# Patient Record
Sex: Female | Born: 1940 | Race: White | Hispanic: No | Marital: Married | Smoking: Former smoker
Health system: Southern US, Community
[De-identification: ages and names within clinical notes are randomized; demographics above are authoritative.]

## PROBLEM LIST (undated history)

## (undated) DIAGNOSIS — R011 Cardiac murmur, unspecified: Secondary | ICD-10-CM

## (undated) DIAGNOSIS — I10 Essential (primary) hypertension: Secondary | ICD-10-CM

## (undated) DIAGNOSIS — E119 Type 2 diabetes mellitus without complications: Secondary | ICD-10-CM

## (undated) DIAGNOSIS — I519 Heart disease, unspecified: Secondary | ICD-10-CM

## (undated) DIAGNOSIS — E785 Hyperlipidemia, unspecified: Secondary | ICD-10-CM

## (undated) HISTORY — DX: Cardiac murmur, unspecified: R01.1

## (undated) HISTORY — PX: TUBAL LIGATION: SHX77

## (undated) HISTORY — PX: CARDIAC SURGERY: SHX584

## (undated) HISTORY — PX: CHOLECYSTECTOMY: SHX55

## (undated) HISTORY — DX: Heart disease, unspecified: I51.9

## (undated) HISTORY — PX: EYE SURGERY: SHX253

## (undated) HISTORY — PX: APPENDECTOMY: SHX54

---

## 1967-06-01 HISTORY — PX: OTHER SURGICAL HISTORY: SHX169

## 2000-05-31 HISTORY — PX: OTHER SURGICAL HISTORY: SHX169

## 2007-04-14 ENCOUNTER — Ambulatory Visit: Payer: Self-pay | Admitting: Endocrinology

## 2007-04-17 ENCOUNTER — Ambulatory Visit: Payer: Self-pay | Admitting: Endocrinology

## 2007-06-06 ENCOUNTER — Ambulatory Visit: Payer: Self-pay | Admitting: Vascular Surgery

## 2008-09-19 ENCOUNTER — Ambulatory Visit: Payer: Self-pay | Admitting: Gastroenterology

## 2010-05-26 ENCOUNTER — Ambulatory Visit: Payer: Self-pay | Admitting: Endocrinology

## 2013-03-19 ENCOUNTER — Emergency Department: Payer: Self-pay | Admitting: Emergency Medicine

## 2014-12-17 ENCOUNTER — Ambulatory Visit (INDEPENDENT_AMBULATORY_CARE_PROVIDER_SITE_OTHER): Payer: Medicare Other | Admitting: Podiatry

## 2014-12-17 ENCOUNTER — Encounter: Payer: Self-pay | Admitting: Podiatry

## 2014-12-17 VITALS — BP 162/73 | HR 77 | Resp 16

## 2014-12-17 DIAGNOSIS — L853 Xerosis cutis: Secondary | ICD-10-CM

## 2014-12-17 DIAGNOSIS — R234 Changes in skin texture: Secondary | ICD-10-CM

## 2014-12-17 MED ORDER — UREA 39 % EX CREA: 1.0000 "application " | TOPICAL_CREAM | Freq: Every day | CUTANEOUS | Status: DC

## 2014-12-17 NOTE — Progress Notes (Signed)
   Subjective:    Patient ID: Wendy Taylor, female    DOB: March 29, 1941, 74 y.o.   MRN: [id number]  HPI 74 year old female presents the office today with concerns of dry cracked heels bilateral feet. She denies any bleeding denies any redness or drainage. She has tried some over-the-counter moisturizers without any relief. She says the area does become painful at the crack deeper. No other complaints at this time.   Review of Systems  All other systems reviewed and are negative.      Objective:   Physical Exam AAO x3, NAD DP/PT pulses palpable bilaterally, CRT less than 3 seconds Protective sensation intact with Simms Weinstein monofilament, vibratory sensation intact, Achilles tendon reflex intact There is dry cracked heels bilaterally with fissures present. There is no bleeding or open wounds present at this time. No areas of tenderness to bilateral lower extremities. MMT 5/5, ROM WNL.  No open lesions or pre-ulcerative lesions.  No overlying edema, erythema, increase in warmth to bilateral lower extremities.  No pain with calf compression, swelling, warmth, erythema bilaterally.      Assessment & Plan:  74 year old female with cirrhosis, heel fissures -Treatment options discussed including all alternatives, risks, and complications -Fissures were debrided hyperkeratotic tissue.  -Prescribed urea 49% cream. -Follow-up is symptoms are not resolved within 4-6 weeks or sooner if any problems arise. In the meantime, encouraged to call the office with any questions, concerns, change in symptoms.   Ovid CurdMatthew Wagoner, DPM

## 2015-07-09 ENCOUNTER — Other Ambulatory Visit: Payer: Self-pay | Admitting: Family Medicine

## 2015-07-09 DIAGNOSIS — Z13820 Encounter for screening for osteoporosis: Secondary | ICD-10-CM

## 2016-02-06 ENCOUNTER — Emergency Department: Payer: Medicare HMO

## 2016-02-06 ENCOUNTER — Emergency Department
Admission: EM | Admit: 2016-02-06 | Discharge: 2016-02-06 | Disposition: A | Discharge status: Home / Self Care | Payer: Medicare HMO | Attending: Emergency Medicine | Admitting: Emergency Medicine

## 2016-02-06 DIAGNOSIS — E119 Type 2 diabetes mellitus without complications: Secondary | ICD-10-CM | POA: Diagnosis not present

## 2016-02-06 DIAGNOSIS — Z7984 Long term (current) use of oral hypoglycemic drugs: Secondary | ICD-10-CM | POA: Insufficient documentation

## 2016-02-06 DIAGNOSIS — K5901 Slow transit constipation: Secondary | ICD-10-CM | POA: Insufficient documentation

## 2016-02-06 DIAGNOSIS — Z794 Long term (current) use of insulin: Secondary | ICD-10-CM | POA: Insufficient documentation

## 2016-02-06 DIAGNOSIS — Z87891 Personal history of nicotine dependence: Secondary | ICD-10-CM | POA: Insufficient documentation

## 2016-02-06 DIAGNOSIS — R339 Retention of urine, unspecified: Secondary | ICD-10-CM | POA: Insufficient documentation

## 2016-02-06 DIAGNOSIS — I10 Essential (primary) hypertension: Secondary | ICD-10-CM | POA: Diagnosis not present

## 2016-02-06 DIAGNOSIS — K5900 Constipation, unspecified: Secondary | ICD-10-CM | POA: Diagnosis present

## 2016-02-06 LAB — BASIC METABOLIC PANEL
Anion gap: 8 (ref 5–15)
BUN: 13 mg/dL (ref 6–20)
CHLORIDE: 96 mmol/L — AB (ref 101–111)
CO2: 26 mmol/L (ref 22–32)
Calcium: 9.3 mg/dL (ref 8.9–10.3)
Creatinine, Ser: 0.73 mg/dL (ref 0.44–1.00)
GFR calc Af Amer: >60 mL/min (ref >60)
GFR calc non Af Amer: >60 mL/min (ref >60)
GLUCOSE: 124 mg/dL — AB (ref 65–99)
POTASSIUM: 4.3 mmol/L (ref 3.5–5.1)
SODIUM: 130 mmol/L — AB (ref 135–145)

## 2016-02-06 LAB — CBC
HEMATOCRIT: 34.9 % — AB (ref 35.0–47.0)
HEMOGLOBIN: 12.1 g/dL (ref 12.0–16.0)
MCH: 31.4 pg (ref 26.0–34.0)
MCHC: 34.8 g/dL (ref 32.0–36.0)
MCV: 90.1 fL (ref 80.0–100.0)
Platelets: 264 10*3/uL (ref 150–440)
RBC: 3.87 MIL/uL (ref 3.80–5.20)
RDW: 13.9 % (ref 11.5–14.5)
WBC: 10.9 10*3/uL (ref 3.6–11.0)

## 2016-02-06 LAB — URINALYSIS COMPLETE WITH MICROSCOPIC (ARMC ONLY)
BACTERIA UA: NONE SEEN
Bilirubin Urine: NEGATIVE
Glucose, UA: 50 mg/dL — AB
Hgb urine dipstick: NEGATIVE
Ketones, ur: NEGATIVE mg/dL
Leukocytes, UA: NEGATIVE
Nitrite: NEGATIVE
PROTEIN: NEGATIVE mg/dL
SPECIFIC GRAVITY, URINE: 1.011 (ref 1.005–1.030)
WBC UA: NONE SEEN WBC/hpf (ref 0–5)
pH: 7 (ref 5.0–8.0)

## 2016-02-06 MED ORDER — MINERAL OIL RE ENEM
1.0000 | ENEMA | Freq: Once | RECTAL | Status: AC
  Administered 2016-02-06: 1 via RECTAL

## 2016-02-06 MED ORDER — DOCUSATE SODIUM 100 MG PO CAPS: 100.0000 mg | ORAL_CAPSULE | Freq: Two times a day (BID) | ORAL | 2 refills | Status: DC | PRN

## 2016-02-06 NOTE — ED Provider Notes (Signed)
[facility] Emergency Department Provider Note   ____________________________________________    I have reviewed the triage vital signs and the nursing notes.   HISTORY  Chief Complaint Constipation     HPI Wendy Taylor is a 75 y.o. female who presents with complaints of constipation. Patient reports she had an eye procedure done 3 days ago and since then has not had a bowel movement. She reports they did "not put me fully under ". She notes is unusual for her to not have a bowel movement daily. She denies abdominal pain but feels mildly bloated. No fevers or chills. No nausea or vomiting. Taking milk of magnesia without improvement.   No past medical history on file.  There are no active problems to display for this patient.   No past surgical history on file.  Prior to Admission medications   Medication Sig Start Date End Date Taking? Authorizing Provider  amLODipine (NORVASC) 2.5 MG tablet  11/20/14   Historical Provider, MD  aspirin 325 MG tablet Take by mouth.    Historical Provider, MD  Cholecalciferol (VITAMIN D-1000 MAX ST) 1000 UNITS tablet Take by mouth.    Historical Provider, MD  clopidogrel (PLAVIX) 75 MG tablet  11/20/14   Historical Provider, MD  HUMALOG MIX 50/50 KWIKPEN (50-50) 100 UNIT/ML Kwikpen  12/10/14   Historical Provider, MD  lisinopril (PRINIVIL,ZESTRIL) 20 MG tablet  11/20/14   Historical Provider, MD  loratadine (CLARITIN) 10 MG tablet Take 10 mg by mouth daily.    Historical Provider, MD  metFORMIN (GLUCOPHAGE) 500 MG tablet Daily. 02/03/12   Historical Provider, MD  simvastatin (ZOCOR) 20 MG tablet Take by mouth.    Historical Provider, MD  Urea 39 % CREA Apply 1 application topically daily. 12/17/14   Vivi Barrack, DPM  vitamin B-12 (CYANOCOBALAMIN) 1000 MCG tablet Take 1,000 mcg by mouth daily.    Historical Provider, MD     Allergies Cephalexin; Ciprofloxacin; and Penicillins  No family history on  file.  Social History Social History  Substance Use Topics  . Smoking status: Former Games developer  . Smokeless tobacco: Never Used  . Alcohol use Not on file    Review of Systems  Constitutional: No fever/chills Eyes: Recent right eye procedure ENT: No sore throat. Cardiovascular: Denies chest pain. Respiratory: Denies shortness of breath. The cough Gastrointestinal: No abdominal pain.  No nausea, no vomiting. Constipation as above   Genitourinary: Negative for dysuria. Long history of difficulty urinating Musculoskeletal: Negative for back pain. Skin: Negative for rash. Neurological: Negative for headaches   10-point ROS otherwise negative.  ____________________________________________   PHYSICAL EXAM:  VITAL SIGNS: ED Triage Vitals  Enc Vitals Group     BP 02/06/16 0507 (!) 162/66     Pulse Rate 02/06/16 0507 (!) 103     Resp 02/06/16 0507 15     Temp 02/06/16 0507 98.1 F (36.7 C)     Temp Source 02/06/16 0507 Oral     SpO2 02/06/16 0507 97 %     Weight 02/06/16 0508 160 lb (72.6 kg)     Height 02/06/16 0508 5\' 2"  (1.575 m)     Head Circumference --      Peak Flow --      Pain Score 02/06/16 0748 5     Pain Loc --      Pain Edu? --      Excl. in GC? --     Constitutional: Alert and oriented. No acute distress.  Pleasant and interactive Eyes: Conjunctivae are normal.  Head: Atraumatic. Nose: No congestion/rhinnorhea. Mouth/Throat: Mucous membranes are moist.   Neck:  Painless ROM Cardiovascular: Normal rate, regular rhythm. Grossly normal heart sounds.  Good peripheral circulation. Respiratory: Normal respiratory effort.  No retractions. Lungs CTAB. Gastrointestinal: Soft and nontender. No distention.  No CVA tenderness. Genitourinary: deferred, Foley placed by nurse prior to my arrival Musculoskeletal: No lower extremity tenderness nor edema.  Warm and well perfused Neurologic:  Normal speech and language. No gross focal neurologic deficits are appreciated.   Skin:  Skin is warm, dry and intact. No rash noted. Psychiatric: Mood and affect are normal. Speech and behavior are normal.  ____________________________________________   LABS (all labs ordered are listed, but only abnormal results are displayed)  Labs Reviewed - No data to display ____________________________________________  EKG  None ____________________________________________  RADIOLOGY  X-ray shows mild to moderate stool burden, no free air ____________________________________________   PROCEDURES  Procedure(s) performed: No    Critical Care performed: No ____________________________________________   INITIAL IMPRESSION / ASSESSMENT AND PLAN / ED COURSE  Pertinent labs & imaging results that were available during my care of the patient were reviewed by me and considered in my medical decision making (see chart for details).  She presents with complaints of constipation and abdominal bloating. Found to have greater than 1000 cc of urine, relief of discomfort with full catheter. X-ray shows mild to moderate stool burden, we will give an enema. But I suspect urinary retention is related to sedation medication used for her procedure.  Clinical Course  No significant stool after enema. Discussed with the patient that we will send her home with a Foley, leg bag and follow-up with urology which she agrees with. Discussed return precautions including worsening abdominal pain, fever or chills nausea or vomiting ____________________________________________   FINAL CLINICAL IMPRESSION(S) / ED DIAGNOSES  Final diagnoses:  Urinary retention  Slow transit constipation      NEW MEDICATIONS STARTED DURING THIS VISIT:  New Prescriptions   No medications on file     Note:  This document was prepared using Dragon voice recognition software and may include unintentional dictation errors.    Jene Everyobert Lydiah Pong, MD 02/06/16 [phone]

## 2016-02-06 NOTE — ED Triage Notes (Signed)
Pt to triage via w/c with no distress noted; pt reports no BM x week; denies abd pain, denies N/V; has taken MOM without relief

## 2016-02-06 NOTE — ED Notes (Signed)
Pt bladder scanned. Reading at .

## 2016-02-06 NOTE — ED Notes (Signed)
Pt stating that she has not been able to "go to the bathroom." Pt stating that she had cataract surgery on Tuesday and has not had a BM since then. Pt stating that she is passing minimal gas. Pt stating she has a BM usually every other day. Pt stating that she hasn't voided since 10pm yesterday. Pt stating I was unable to void overnight. Pt took MOM at home twice.

## 2016-02-06 NOTE — ED Notes (Signed)
Report given to Donald, RN. 

## 2016-02-07 ENCOUNTER — Emergency Department
Admission: EM | Admit: 2016-02-07 | Discharge: 2016-02-07 | Disposition: A | Discharge status: Home / Self Care | Payer: Medicare HMO | Attending: Emergency Medicine | Admitting: Emergency Medicine

## 2016-02-07 ENCOUNTER — Encounter: Payer: Self-pay | Admitting: Emergency Medicine

## 2016-02-07 DIAGNOSIS — R319 Hematuria, unspecified: Secondary | ICD-10-CM | POA: Diagnosis present

## 2016-02-07 DIAGNOSIS — E119 Type 2 diabetes mellitus without complications: Secondary | ICD-10-CM | POA: Diagnosis not present

## 2016-02-07 DIAGNOSIS — Z79899 Other long term (current) drug therapy: Secondary | ICD-10-CM | POA: Insufficient documentation

## 2016-02-07 DIAGNOSIS — Z7982 Long term (current) use of aspirin: Secondary | ICD-10-CM | POA: Diagnosis not present

## 2016-02-07 DIAGNOSIS — I10 Essential (primary) hypertension: Secondary | ICD-10-CM | POA: Insufficient documentation

## 2016-02-07 DIAGNOSIS — Z7984 Long term (current) use of oral hypoglycemic drugs: Secondary | ICD-10-CM | POA: Diagnosis not present

## 2016-02-07 DIAGNOSIS — Z87891 Personal history of nicotine dependence: Secondary | ICD-10-CM | POA: Diagnosis not present

## 2016-02-07 HISTORY — DX: Type 2 diabetes mellitus without complications: E11.9

## 2016-02-07 HISTORY — DX: Essential (primary) hypertension: I10

## 2016-02-07 HISTORY — DX: Hyperlipidemia, unspecified: E78.5

## 2016-02-07 LAB — URINALYSIS COMPLETE WITH MICROSCOPIC (ARMC ONLY)
BILIRUBIN URINE: NEGATIVE
Glucose, UA: NEGATIVE mg/dL
KETONES UR: NEGATIVE mg/dL
LEUKOCYTES UA: NEGATIVE
NITRITE: NEGATIVE
PH: 6 (ref 5.0–8.0)
PROTEIN: NEGATIVE mg/dL
SQUAMOUS EPITHELIAL / LPF: NONE SEEN
Specific Gravity, Urine: 1.008 (ref 1.005–1.030)

## 2016-02-07 NOTE — ED Provider Notes (Signed)
[facility] Emergency Department Provider Note  Time seen: 4:40 PM  I have reviewed the triage vital signs and the nursing notes.   HISTORY  Chief Complaint Hematuria    HPI Wendy Taylor is a 75 y.o. female with a past medical history of diabetes, hypertension, hyperlipidemia who presents the emergency department for hematuria. According to the patient she had eye surgery performed several days ago, was feeling constipated unable to urinate so she came to the emergency department yesterday had greater than 1 L of urine in her bladder and a Foley catheter was placed. Patient went home yesterday from the emergency department with a leg bag, catheter in place with urology follow-up on Monday. Patient states today she notice what appears to be blood in the urine so she came to the emergency department for evaluation. Denies any abdominal pain. States she has had several bowel movements since going home. Denies any fever.  Past Medical History:  Diagnosis Date  . Diabetes mellitus without complication (HCC)   . Hyperlipidemia   . Hypertension     There are no active problems to display for this patient.   Past Surgical History:  Procedure Laterality Date  . cardiac bypass   2002  . CHOLECYSTECTOMY    . EYE SURGERY    . tubial ligation   1969    Prior to Admission medications   Medication Sig Start Date End Date Taking? Authorizing Provider  amLODipine (NORVASC) 2.5 MG tablet  11/20/14   Historical Provider, MD  aspirin 325 MG tablet Take by mouth.    Historical Provider, MD  Cholecalciferol (VITAMIN D-1000 MAX ST) 1000 UNITS tablet Take by mouth.    Historical Provider, MD  clopidogrel (PLAVIX) 75 MG tablet  11/20/14   Historical Provider, MD  docusate sodium (COLACE) 100 MG capsule Take 1 capsule (100 mg total) by mouth 2 (two) times daily as needed for moderate constipation. 02/06/16 02/05/17  Jene Everyobert Kinner, MD  HUMALOG MIX 50/50 KWIKPEN (50-50) 100 UNIT/ML  Stephanie CoupKwikpen  12/10/14   Historical Provider, MD  lisinopril (PRINIVIL,ZESTRIL) 20 MG tablet  11/20/14   Historical Provider, MD  loratadine (CLARITIN) 10 MG tablet Take 10 mg by mouth daily.    Historical Provider, MD  metFORMIN (GLUCOPHAGE) 500 MG tablet Daily. 02/03/12   Historical Provider, MD  simvastatin (ZOCOR) 20 MG tablet Take by mouth.    Historical Provider, MD  Urea 39 % CREA Apply 1 application topically daily. 12/17/14   Vivi BarrackMatthew R Wagoner, DPM  vitamin B-12 (CYANOCOBALAMIN) 1000 MCG tablet Take 1,000 mcg by mouth daily.    Historical Provider, MD    Allergies  Allergen Reactions  . Cephalexin Nausea And Vomiting  . Ciprofloxacin Nausea And Vomiting  . Penicillins Nausea And Vomiting    No family history on file.  Social History Social History  Substance Use Topics  . Smoking status: Former Games developermoker  . Smokeless tobacco: Never Used  . Alcohol use No    Review of Systems Constitutional: Negative for fever. Cardiovascular: Negative for chest pain. Respiratory: Negative for shortness of breath. Gastrointestinal: Negative for abdominal pain Genitourinary: Negative for dysuria.Foley catheter in place. Positive for hematuria. Musculoskeletal: Negative for back pain. Neurological: Negative for headache 10-point ROS otherwise negative.  ____________________________________________   PHYSICAL EXAM:  VITAL SIGNS: ED Triage Vitals  Enc Vitals Group     BP 02/07/16 1546 (!) 161/60     Pulse Rate 02/07/16 1546 94     Resp 02/07/16 1546 18  Temp 02/07/16 1546 98.1 F (36.7 C)     Temp Source 02/07/16 1546 Oral     SpO2 02/07/16 1546 96 %     Weight --      Height --      Head Circumference --      Peak Flow --      Pain Score 02/07/16 1623 0     Pain Loc --      Pain Edu? --      Excl. in GC? --     Constitutional: Alert and oriented. Well appearing and in no distress. Eyes: Normal exam ENT   Head: Normocephalic and atraumatic   Mouth/Throat: Mucous  membranes are moist. Cardiovascular: Normal rate, regular rhythm. No murmur Respiratory: Normal respiratory effort without tachypnea nor retractions. Breath sounds are clear Gastrointestinal: Soft and nontender. No distention.  Musculoskeletal: Nontender with normal range of motion in all extremities.  Neurologic:  Normal speech and language. No gross focal neurologic deficits Skin:  Skin is warm, dry and intact.  Psychiatric: Mood and affect are normal. ____________________________________________   INITIAL IMPRESSION / ASSESSMENT AND PLAN / ED COURSE  Pertinent labs & imaging results that were available during my care of the patient were reviewed by me and considered in my medical decision making (see chart for details).  Overall the patient appears very well, has noted blood in her urine today. We will check a urinalysis as well as a urine culture. I suspect the blood is likely due to to your dictation/inflammation from catheter insertion. Patient has no abdominal tenderness, denies abdominal pain.  Urinalysis shows blood but no white blood cells or bacteria. Suspect hematuria is from traumatic Foley insertion. I sent a urine culture. Patient has a follow-up with urology on Monday.  ____________________________________________   FINAL CLINICAL IMPRESSION(S) / ED DIAGNOSES  Hematuria    Minna Antis, MD 02/07/16 1751

## 2016-02-07 NOTE — ED Triage Notes (Signed)
Pt seen yesterday for urine blockage and had a cathter placed with leg bag. Pt states at noon today she notice blood in urine. Urine Pink in color moderate amount. Small clots noted in leg bag. Denies pain

## 2016-02-07 NOTE — ED Notes (Signed)
Discharge instructions reviewed with patient. Patient verbalized understanding. Patient ambulated to lobby without difficulty.   

## 2016-02-09 ENCOUNTER — Encounter: Payer: Self-pay | Admitting: Urology

## 2016-02-09 ENCOUNTER — Ambulatory Visit (INDEPENDENT_AMBULATORY_CARE_PROVIDER_SITE_OTHER): Payer: Medicare HMO | Admitting: Urology

## 2016-02-09 VITALS — BP 165/101 | HR 91 | Ht 62.0 in | Wt 160.6 lb

## 2016-02-09 DIAGNOSIS — R338 Other retention of urine: Secondary | ICD-10-CM | POA: Diagnosis not present

## 2016-02-09 DIAGNOSIS — R310 Gross hematuria: Secondary | ICD-10-CM | POA: Diagnosis not present

## 2016-02-09 LAB — URINE CULTURE: Culture: NO GROWTH

## 2016-02-09 NOTE — Progress Notes (Signed)
02/09/2016 3:49 PM   Wendy Taylor 1940/11/26 [id number]  Referring provider: Titus Mould, NP [address] [place], [place] [address]  Chief Complaint  Patient presents with  . Hematuria    New patient ER follow up  . Urinary Retention    HPI: Patient is a 75 year old Caucasian female who is referred by [facility] ED for urinary retention.  She states that she underwent a cataract procedures 3 days prior to her episode of urinary retention. She was also experiencing constipation for which she could not relieve.  She also went into urinary retention.  The emergency room a Foley catheter was placed and she was found to have 1000 cc of urine contained within her bladder. She also received an enema in the emergency department.  The next day she experienced gross hematuria and was seen in the emergency room again.  Urine culture from the emergency room noted no growth.  Patient states prior to her cataract procedures she had no difficulty with urination.  She also did not suffer with constipation and had a bowel movement daily.  After her cataract procedure,  she started to have the increasing feelings of incomplete bladder emptying and urinary hesitancy.  She does not have a prior history of urinary incontinence, straining to urinate, urinary tract infections, gross hematuria or nephrolithiasis.  She has not had any difficulty with the Foley catheter since it has been inserted for the exception of the incident of gross hematuria.  She has not had fevers, chills, nausea or vomiting.    She is no longer constipated.  PMH: Past Medical History:  Diagnosis Date  . Diabetes mellitus without complication (HCC)   . Heart disease   . Heart murmur   . Hyperlipidemia   . Hypertension     Surgical History: Past Surgical History:  Procedure Laterality Date  . cardiac bypass   2002  . CHOLECYSTECTOMY    . EYE SURGERY    . tubial ligation   1969    Home Medications:    Medication List       Accurate as of 02/09/16 11:59 PM. Always use your most recent med list.          amLODipine 2.5 MG tablet Commonly known as:  NORVASC   azelastine 0.05 % ophthalmic solution Commonly known as:  OPTIVAR   clopidogrel 75 MG tablet Commonly known as:  PLAVIX   docusate sodium 100 MG capsule Commonly known as:  COLACE Take 1 capsule (100 mg total) by mouth 2 (two) times daily as needed for moderate constipation.   gabapentin 300 MG capsule Commonly known as:  NEURONTIN Take 300 mg by mouth.   insulin NPH-regular Human (70-30) 100 UNIT/ML injection Commonly known as:  NOVOLIN 70/30 Inject into the skin.   lisinopril 20 MG tablet Commonly known as:  PRINIVIL,ZESTRIL   loratadine 10 MG tablet Commonly known as:  CLARITIN Take 10 mg by mouth daily.   metFORMIN 500 MG tablet Commonly known as:  GLUCOPHAGE Daily.   ofloxacin 0.3 % ophthalmic solution Commonly known as:  OCUFLOX   simvastatin 20 MG tablet Commonly known as:  ZOCOR Take by mouth.   Urea 39 % Crea Apply 1 application topically daily.   vitamin B-12 1000 MCG tablet Commonly known as:  CYANOCOBALAMIN Take 1,000 mcg by mouth daily.   VITAMIN D-1000 MAX ST 1000 units tablet Generic drug:  Cholecalciferol Take by mouth.       Allergies:  Allergies  Allergen  Reactions  . Canagliflozin Other (See Comments)  . Cephalexin Nausea And Vomiting  . Ciprofloxacin Nausea And Vomiting  . Penicillins Nausea And Vomiting    Family History: Family History  Problem Relation Age of Onset  . Ovarian cancer Sister   . Kidney disease Neg Hx   . Bladder Cancer Neg Hx     Social History:  reports that she has quit smoking. She has never used smokeless tobacco. She reports that she does not drink alcohol. Her drug history is not on file.  ROS: UROLOGY Frequent Urination?: No Hard to postpone urination?: No Burning/pain with urination?: No Get up at night to urinate?: No Leakage of  urine?: No Urine stream starts and stops?: No Trouble starting stream?: No Do you have to strain to urinate?: No Blood in urine?: No Urinary tract infection?: No Sexually transmitted disease?: No Injury to kidneys or bladder?: No Painful intercourse?: No Weak stream?: No Currently pregnant?: No Vaginal bleeding?: No Last menstrual period?: n  Gastrointestinal Nausea?: No Vomiting?: No Indigestion/heartburn?: No Diarrhea?: No Constipation?: Yes  Constitutional Fever: No Night sweats?: No Weight loss?: No Fatigue?: No  Skin Skin rash/lesions?: No Itching?: No  Eyes Blurred vision?: No Double vision?: No  Ears/Nose/Throat Sore throat?: No Sinus problems?: No  Hematologic/Lymphatic Swollen glands?: No Easy bruising?: No  Cardiovascular Leg swelling?: No Chest pain?: No  Respiratory Cough?: No Shortness of breath?: No  Endocrine Excessive thirst?: No  Musculoskeletal Back pain?: No Joint pain?: No  Neurological Headaches?: No Dizziness?: No  Psychologic Depression?: No Anxiety?: No  Physical Exam: BP (!) 165/101   Pulse 91   Ht 5\' 2"  (1.575 m)   Wt 160 lb 9.6 oz (72.8 kg)   LMP  (LMP Unknown)   BMI 29.37 kg/m   Constitutional: Well nourished. Alert and oriented, No acute distress. HEENT: [place] AT, moist mucus membranes. Trachea midline, no masses. Cardiovascular: No clubbing, cyanosis, or edema. Respiratory: Normal respiratory effort, no increased work of breathing. GI: Abdomen is soft, non tender, non distended, no abdominal masses. Liver and spleen not palpable.  No hernias appreciated.  Stool sample for occult testing is not indicated.   GU: No CVA tenderness.  No bladder fullness or masses.  Foley in place draining clear yellow urine.  Skin: No rashes, bruises or suspicious lesions. Lymph: No cervical or inguinal adenopathy. Neurologic: Grossly intact, no focal deficits, moving all 4 extremities. Psychiatric: Normal mood and  affect.  Laboratory Data: Lab Results  Component Value Date   WBC 10.9 02/06/2016   HGB 12.1 02/06/2016   HCT 34.9 (L) 02/06/2016   MCV 90.1 02/06/2016   PLT 264 02/06/2016    Lab Results  Component Value Date   CREATININE 0.73 02/06/2016      Assessment & Plan:    1. Acute urinary retention:     - foley catheter to be removed on Friday am  - offered to instruct patient on how to remove the Foley-patient declined  - if she passes voiding trial on Friday follow up in one month  -return if unable to urinate or experiencing suprapubic discomfort  -follow-up in one month for I PSS score and PVR  2. Gross hematuria  - hematuria most likely to bladder injury  - Explained to the patient that when the bladder suffers acute retention with large volumes, microscopic tears can occur within the lining of the bladder, once the urine is drained from the bladder at the tamponade effect of the urine is diminished and the  microscopic tears can bleed  - Reassured the patient that this was a normal phenomenon  - Will recheck urine when patient returns in 1 month if she successfully passes her voiding trial  Return in about 1 week (around 02/16/2016) for in the AM for foley catheter removal only on the nurses schedule.  These notes generated with voice recognition software. I apologize for typographical errors.  Michiel Cowboy, PA-C  [facility] [address], [address] [place], [place] [address] [phone]

## 2016-02-13 ENCOUNTER — Ambulatory Visit: Payer: Medicare HMO | Admitting: Family Medicine

## 2016-02-13 DIAGNOSIS — R339 Retention of urine, unspecified: Secondary | ICD-10-CM

## 2016-02-13 NOTE — Progress Notes (Signed)
Catheter Removal  Patient is present today for a catheter removal.  10ml of water was drained from the balloon. A 16FR foley cath was removed from the bladder no complications were noted . Patient tolerated well.  Preformed by: Trixy Loyola, CMA  Follow up/ Additional notes: She is to return this afternoon if she is unable to void.  

## 2016-03-03 ENCOUNTER — Ambulatory Visit (INDEPENDENT_AMBULATORY_CARE_PROVIDER_SITE_OTHER): Payer: Medicare HMO | Admitting: Podiatry

## 2016-03-03 ENCOUNTER — Ambulatory Visit: Payer: Medicare Other | Admitting: Podiatry

## 2016-03-03 ENCOUNTER — Encounter: Payer: Self-pay | Admitting: Podiatry

## 2016-03-03 DIAGNOSIS — E1142 Type 2 diabetes mellitus with diabetic polyneuropathy: Secondary | ICD-10-CM | POA: Diagnosis not present

## 2016-03-03 DIAGNOSIS — R234 Changes in skin texture: Secondary | ICD-10-CM | POA: Diagnosis not present

## 2016-03-03 DIAGNOSIS — L853 Xerosis cutis: Secondary | ICD-10-CM

## 2016-03-03 DIAGNOSIS — E1151 Type 2 diabetes mellitus with diabetic peripheral angiopathy without gangrene: Secondary | ICD-10-CM

## 2016-03-03 MED ORDER — GABAPENTIN 300 MG PO CAPS: 300.0000 mg | ORAL_CAPSULE | Freq: Every day | ORAL | 3 refills | Status: DC

## 2016-03-04 ENCOUNTER — Telehealth: Payer: Self-pay | Admitting: *Deleted

## 2016-03-04 DIAGNOSIS — R0989 Other specified symptoms and signs involving the circulatory and respiratory systems: Secondary | ICD-10-CM

## 2016-03-04 NOTE — Telephone Encounter (Addendum)
-----   Message from Kristian CoveyAshley E Prevette, [facility] sent at 03/03/2016  5:38 PM EDT ----- Regarding: Vascular studies Arterial doppler-Decreased pulses Thanks! 03/04/2016-Faxed orders, referral and clinicals and demographics to [facility].

## 2016-03-04 NOTE — Progress Notes (Signed)
She presents today with chief concern of dry cracked feet. She states that her feet just hurt even at nighttime while she is in bed. She has a history of diabetes but according to her she has no history of diabetic peripheral neuropathy she is just concerned about what appears to be cracks in her feet.  Objective: Vital signs are stable she is alert and oriented 3. Pulses are minimally palpable bilateral feet are cool to the touch is cyanosis of the toes upon elevation. She has loss of sensation per Semmes-Weinstein to the distal aspects of the toes and of the forefoot beneath the metatarsal heads. Vibratory sensation is lessened bilaterally deep pain sensation is intact. No open lesions or wounds she does have some fissures which are not D with simply calloused around the first metatarsophalangeal joints bilateral. There are no open lesions or wounds.  Assessment: Diabetic peripheral neuropathy with diabetic angiopathy.  Plan: I'm referring her to [facility] in vascular for evaluation of her arterial flow. She also has diabetic peripheral neuropathy and we are going to start her on gabapentin 300 mg 1 by mouth daily at bedtime. I will follow-up with her in 1 month for a med check.

## 2016-03-26 ENCOUNTER — Encounter (INDEPENDENT_AMBULATORY_CARE_PROVIDER_SITE_OTHER): Payer: Self-pay | Admitting: Vascular Surgery

## 2016-03-26 ENCOUNTER — Ambulatory Visit (INDEPENDENT_AMBULATORY_CARE_PROVIDER_SITE_OTHER): Payer: Medicare HMO | Admitting: Vascular Surgery

## 2016-03-26 VITALS — BP 139/66 | HR 83 | Resp 16 | Ht 62.0 in | Wt 164.0 lb

## 2016-03-26 DIAGNOSIS — I10 Essential (primary) hypertension: Secondary | ICD-10-CM

## 2016-03-26 DIAGNOSIS — E118 Type 2 diabetes mellitus with unspecified complications: Secondary | ICD-10-CM | POA: Diagnosis not present

## 2016-03-26 DIAGNOSIS — Z794 Long term (current) use of insulin: Secondary | ICD-10-CM | POA: Diagnosis not present

## 2016-03-26 DIAGNOSIS — E119 Type 2 diabetes mellitus without complications: Secondary | ICD-10-CM | POA: Insufficient documentation

## 2016-03-26 DIAGNOSIS — E785 Hyperlipidemia, unspecified: Secondary | ICD-10-CM

## 2016-03-26 DIAGNOSIS — M79604 Pain in right leg: Secondary | ICD-10-CM | POA: Diagnosis not present

## 2016-03-26 DIAGNOSIS — M79609 Pain in unspecified limb: Secondary | ICD-10-CM | POA: Insufficient documentation

## 2016-03-26 DIAGNOSIS — M79605 Pain in left leg: Secondary | ICD-10-CM | POA: Diagnosis not present

## 2016-03-26 DIAGNOSIS — I70213 Atherosclerosis of native arteries of extremities with intermittent claudication, bilateral legs: Secondary | ICD-10-CM | POA: Diagnosis not present

## 2016-03-26 NOTE — Assessment & Plan Note (Signed)
blood glucose control important in reducing the progression of atherosclerotic disease. Also, involved in wound healing. On appropriate medications.  

## 2016-03-26 NOTE — Progress Notes (Signed)
Patient ID: Wendy Taylor, female   DOB: 1941-02-03, 75 y.o.   MRN: [id number]  Chief Complaint  Patient presents with  . New Evaluation    Weakened circulation in legs and feet    HPI Wendy Taylor is a 75 y.o. female.  I am asked to see the patient by Dr. Milinda Pointer for evaluation of peripheral arterial disease with poorly palpable pedal pulses.  The patient reports having a stent placed in her right leg for vascular disease about 10 years ago, but this had not been checked in many years. The physician who performed this procedure is no longer in the medical community. She reports pain in her feet particularly at night. This wakes her up at night. She has tried Neurontin but this has not helped and it makes her dizzy. She does have long-standing diabetes as well as hypertension and hyperlipidemia. She does describe tiredness and aching in her legs. Going up steps or any kind of incline is almost impossible due to pain in her muscles. Both lower extremities are affected. The right leg may be a little bit worse. There was no clear inciting event or causative factor that started the symptoms. This has been going on for many years. Nothing really seems to make it too much better.   Past Medical History:  Diagnosis Date  . Diabetes mellitus without complication ([place])   . Heart disease   . Heart murmur   . Hyperlipidemia   . Hypertension     Past Surgical History:  Procedure Laterality Date  . cardiac bypass   2002  . CHOLECYSTECTOMY    . EYE SURGERY    . tubial ligation   1969    Family History  Problem Relation Age of Onset  . Ovarian cancer Sister   . Kidney disease Neg Hx   . Bladder Cancer Neg Hx   No history of bleeding disorders, clotting disorders, or aneurysms  Social History Social History  Substance Use Topics  . Smoking status: Former Research scientist (life sciences)  . Smokeless tobacco: Never Used  . Alcohol use No  No IV drug use  Allergies  Allergen Reactions  . Canagliflozin Other (See  Comments)  . Cephalexin Nausea And Vomiting  . Ciprofloxacin Nausea And Vomiting  . Penicillins Nausea And Vomiting    Current Outpatient Prescriptions  Medication Sig Dispense Refill  . amLODipine (NORVASC) 2.5 MG tablet   1  . azelastine (OPTIVAR) 0.05 % ophthalmic solution     . Cholecalciferol (VITAMIN D-1000 MAX ST) 1000 UNITS tablet Take by mouth.    . clopidogrel (PLAVIX) 75 MG tablet   1  . docusate sodium (COLACE) 100 MG capsule Take 1 capsule (100 mg total) by mouth 2 (two) times daily as needed for moderate constipation. 20 capsule 2  . gabapentin (NEURONTIN) 300 MG capsule Take 300 mg by mouth.    . gabapentin (NEURONTIN) 300 MG capsule Take 1 capsule (300 mg total) by mouth at bedtime. 30 capsule 3  . insulin NPH-regular Human (NOVOLIN 70/30) (70-30) 100 UNIT/ML injection Inject into the skin.    Marland Kitchen lisinopril (PRINIVIL,ZESTRIL) 20 MG tablet   3  . loratadine (CLARITIN) 10 MG tablet Take 10 mg by mouth daily.    . metFORMIN (GLUCOPHAGE) 500 MG tablet Daily.    Marland Kitchen ofloxacin (OCUFLOX) 0.3 % ophthalmic solution     . simvastatin (ZOCOR) 20 MG tablet Take by mouth.    . Urea 39 % CREA Apply 1 application topically daily. 226.8  g 2   No current facility-administered medications for this visit.       REVIEW OF SYSTEMS (Negative unless checked)  Constitutional: '[]' Weight loss  '[]' Fever  '[]' Chills Cardiac: '[]' Chest pain   '[]' Chest pressure   '[]' Palpitations   '[]' Shortness of breath when laying flat   '[]' Shortness of breath at rest   '[]' Shortness of breath with exertion. Vascular:  '[x]' Pain in legs with walking   '[]' Pain in legs at rest   '[]' Pain in legs when laying flat   '[]' Claudication   '[]' Pain in feet when walking  '[x]' Pain in feet at rest  '[]' Pain in feet when laying flat   '[]' History of DVT   '[]' Phlebitis   '[]' Swelling in legs   '[]' Varicose veins   '[]' Non-healing ulcers Pulmonary:   '[]' Uses home oxygen   '[]' Productive cough   '[]' Hemoptysis   '[]' Wheeze  '[]' COPD   '[]' Asthma Neurologic:  '[]' Dizziness   '[]' Blackouts   '[]' Seizures   '[]' History of stroke   '[]' History of TIA  '[]' Aphasia   '[]' Temporary blindness   '[]' Dysphagia   '[]' Weakness or numbness in arms   '[]' Weakness or numbness in legs Musculoskeletal:  '[]' Arthritis   '[x]' Joint swelling   '[x]' Joint pain   '[]' Low back pain Hematologic:  '[]' Easy bruising  '[]' Easy bleeding   '[]' Hypercoagulable state   '[]' Anemic  '[]' Hepatitis Gastrointestinal:  '[]' Blood in stool   '[]' Vomiting blood  '[]' Gastroesophageal reflux/heartburn   '[]' Abdominal pain Genitourinary:  '[]' Chronic kidney disease   '[]' Difficult urination  '[]' Frequent urination  '[]' Burning with urination   '[]' Hematuria Skin:  '[]' Rashes   '[]' Ulcers   '[]' Wounds Psychological:  '[]' History of anxiety   '[]'  History of major depression.    Physical Exam BP 139/66 (BP Location: Right Arm)   Pulse 83   Resp 16   Ht '5\' 2"'  (1.575 m)   Wt 74.4 kg (164 lb)   LMP  (LMP Unknown)   BMI 30.00 kg/m  Gen:  WD/WN, NAD Head: [place]/AT, No temporalis wasting. Prominent temp pulse not noted. Ear/Nose/Throat: Hearing grossly intact, nares w/o erythema or drainage, oropharynx w/o Erythema/Exudate Eyes: Conjunctiva clear, sclera non-icteric  Neck: trachea midline.  No bruit or JVD.  Pulmonary:  Good air movement, clear to auscultation bilaterally.  Cardiac: RRR, normal S1, S2, no Murmurs, rubs or gallops. Vascular:  Vessel Right Left  Radial Palpable Palpable  Ulnar Palpable Palpable  Brachial Palpable Palpable  Carotid Palpable, without bruit Palpable, without bruit  Aorta Not palpable N/A  Femoral Palpable Palpable  Popliteal Not Palpable Not Palpable  PT 1+ Palpable 1+ Palpable  DP 1+ Palpable Trace Palpable   Gastrointestinal: soft, non-tender/non-distended. No guarding/reflex. No masses, surgical incisions, or scars. Musculoskeletal: M/S 5/5 throughout.  No deformity or atrophy. No edema. Scattered varicosities bilaterally Neurologic: Sensation grossly intact in extremities.  Symmetrical.  Speech is fluent. Motor exam as listed  above. Psychiatric: Judgment intact, Mood & affect appropriate for pt's clinical situation. Dermatologic: No rashes or ulcers noted.  No cellulitis or open wounds. Lymph : No Cervical, Axillary, or Inguinal lymphadenopathy.   Radiology No results found.  Labs Recent Results (from the past 2160 hour(s))  CBC     Status: Abnormal   Collection Time: 02/06/16  8:49 AM  Result Value Ref Range   WBC 10.9 3.6 - 11.0 K/uL   RBC 3.87 3.80 - 5.20 MIL/uL   Hemoglobin 12.1 12.0 - 16.0 g/dL   HCT 34.9 (L) 35.0 - 47.0 %   MCV 90.1 80.0 - 100.0 fL   MCH 31.4 26.0 - 34.0 pg  MCHC 34.8 32.0 - 36.0 g/dL   RDW 13.9 11.5 - 14.5 %   Platelets 264 150 - 440 K/uL  Basic metabolic panel     Status: Abnormal   Collection Time: 02/06/16  8:49 AM  Result Value Ref Range   Sodium 130 (L) 135 - 145 mmol/L   Potassium 4.3 3.5 - 5.1 mmol/L   Chloride 96 (L) 101 - 111 mmol/L   CO2 26 22 - 32 mmol/L   Glucose, Bld 124 (H) 65 - 99 mg/dL   BUN 13 6 - 20 mg/dL   Creatinine, Ser 0.73 0.44 - 1.00 mg/dL   Calcium 9.3 8.9 - 10.3 mg/dL   GFR calc non Af Amer >60 >60 mL/min   GFR calc Af Amer >60 >60 mL/min    Comment: (NOTE) The eGFR has been calculated using the CKD EPI equation. This calculation has not been validated in all clinical situations. eGFR's persistently <60 mL/min signify possible Chronic Kidney Disease.    Anion gap 8 5 - 15  Urinalysis complete, with microscopic ([facility] only)     Status: Abnormal   Collection Time: 02/06/16  8:49 AM  Result Value Ref Range   Color, Urine YELLOW (A) YELLOW   APPearance CLEAR (A) CLEAR   Glucose, UA 50 (A) NEGATIVE mg/dL   Bilirubin Urine NEGATIVE NEGATIVE   Ketones, ur NEGATIVE NEGATIVE mg/dL   Specific Gravity, Urine 1.011 1.005 - 1.030   Hgb urine dipstick NEGATIVE NEGATIVE   pH 7.0 5.0 - 8.0   Protein, ur NEGATIVE NEGATIVE mg/dL   Nitrite NEGATIVE NEGATIVE   Leukocytes, UA NEGATIVE NEGATIVE   RBC / HPF 0-5 0 - 5 RBC/hpf   WBC, UA NONE SEEN 0 - 5  WBC/hpf   Bacteria, UA NONE SEEN NONE SEEN   Squamous Epithelial / LPF 0-5 (A) NONE SEEN  Urinalysis complete, with microscopic ([facility] only)     Status: Abnormal   Collection Time: 02/07/16  5:27 PM  Result Value Ref Range   Color, Urine STRAW (A) YELLOW   APPearance CLEAR (A) CLEAR   Glucose, UA NEGATIVE NEGATIVE mg/dL   Bilirubin Urine NEGATIVE NEGATIVE   Ketones, ur NEGATIVE NEGATIVE mg/dL   Specific Gravity, Urine 1.008 1.005 - 1.030   Hgb urine dipstick 3+ (A) NEGATIVE   pH 6.0 5.0 - 8.0   Protein, ur NEGATIVE NEGATIVE mg/dL   Nitrite NEGATIVE NEGATIVE   Leukocytes, UA NEGATIVE NEGATIVE   RBC / HPF TOO NUMEROUS TO COUNT 0 - 5 RBC/hpf   WBC, UA 0-5 0 - 5 WBC/hpf   Bacteria, UA RARE (A) NONE SEEN   Squamous Epithelial / LPF NONE SEEN NONE SEEN  Urine culture     Status: None   Collection Time: 02/07/16  5:27 PM  Result Value Ref Range   Specimen Description URINE, CATHETERIZED    Special Requests NONE    Culture NO GROWTH Performed at [facility]     Report Status 02/09/2016 FINAL     Assessment/Plan:  Pain in limb Although much of her lower extremity symptoms could represent neuropathy from her diabetes, peripheral arterial disease could be playing a major contributing role. Some of her symptoms do sound worrisome for ischemic rest pain and she does describe claudication symptoms. I have recommended arterial duplex and ABIs to be performed at the patient's convenience near future. I discussed the pathophysiology and natural history of peripheral arterial disease. I will see the patient back following the studies to discuss the results and  determine further treatment options. She is on appropriate medical therapy with antiplatelet and statin therapy.  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 She does have a history of peripheral arterial disease and has undergone right lower extremity intervention about 10 years ago. Some of her symptoms do sound worrisome for ischemic rest pain and she does describe claudication symptoms. I have recommended arterial duplex and ABIs to be performed at the patient's convenience near future. I discussed the pathophysiology and natural history of peripheral arterial disease. I will see the patient back following the studies to discuss the results and determine further treatment options. She is on appropriate medical therapy with antiplatelet and statin therapy.  Essential hypertension blood pressure control important in reducing the progression of atherosclerotic disease. On appropriate oral medications.   Diabetes ([place]) blood glucose control important in reducing the progression of atherosclerotic disease. Also, involved in wound healing. On appropriate medications.   Hyperlipidemia lipid control important in reducing the progression of atherosclerotic disease. Continue statin therapy       Leotis Pain 03/26/2016, 3:25 PM   This note was created with Dragon medical transcription system.  Any errors from dictation are unintentional.

## 2016-03-26 NOTE — Assessment & Plan Note (Signed)
Although much of her lower extremity symptoms could represent neuropathy from her diabetes, peripheral arterial disease could be playing a major contributing role. She does have a history of peripheral arterial disease and has undergone right lower extremity intervention about 10 years ago. Some of her symptoms do sound worrisome for ischemic rest pain and she does describe claudication symptoms. I have recommended arterial duplex and ABIs to be performed at the patient's convenience near future. I discussed the pathophysiology and natural history of peripheral arterial disease. I will see the patient back following the studies to discuss the results and determine further treatment options. She is on appropriate medical therapy with antiplatelet and statin therapy.

## 2016-03-26 NOTE — Patient Instructions (Signed)
Peripheral Vascular Disease Peripheral vascular disease (PVD) is a disease of the blood vessels that are not part of your heart and brain. A simple term for PVD is poor circulation. In most cases, PVD narrows the blood vessels that carry blood from your heart to the rest of your body. This can result in a decreased supply of blood to your arms, legs, and internal organs, like your stomach or kidneys. However, it most often affects a person's lower legs and feet. There are two types of PVD.  Organic PVD. This is the more common type. It is caused by damage to the structure of blood vessels.  Functional PVD. This is caused by conditions that make blood vessels contract and tighten (spasm). Without treatment, PVD tends to get worse over time. PVD can also lead to acute ischemic limb. This is when an arm or limb suddenly has trouble getting enough blood. This is a medical emergency. CAUSES Each type of PVD has many different causes. The most common cause of PVD is buildup of a fatty material (plaque) inside of your arteries (atherosclerosis). Small amounts of plaque can break off from the walls of the blood vessels and become lodged in a smaller artery. This blocks blood flow and can cause acute ischemic limb. Other common causes of PVD include:  Blood clots that form inside of blood vessels.  Injuries to blood vessels.  Diseases that cause inflammation of blood vessels or cause blood vessel spasms.  Health behaviors and health history that increase your risk of developing PVD. RISK FACTORS  You may have a greater risk of PVD if you:  Have a family history of PVD.  Have certain medical conditions, including:  High cholesterol.  Diabetes.  High blood pressure (hypertension).  Coronary heart disease.  Past problems with blood clots.  Past injury, such as burns or a broken bone. These may have damaged blood vessels in your limbs.  Buerger disease. This is caused by inflamed blood  vessels in your hands and feet.  Some forms of arthritis.  Rare birth defects that affect the arteries in your legs.  Use tobacco.  Do not get enough exercise.  Are obese.  Are age 50 or older. SIGNS AND SYMPTOMS  PVD may cause many different symptoms. Your symptoms depend on what part of your body is not getting enough blood. Some common signs and symptoms include:  Cramps in your lower legs. This may be a symptom of poor leg circulation (claudication).  Pain and weakness in your legs while you are physically active that goes away when you rest (intermittent claudication).  Leg pain when at rest.  Leg numbness, tingling, or weakness.  Coldness in a leg or foot, especially when compared with the other leg.  Skin or hair changes. These can include:  Hair loss.  Shiny skin.  Pale or bluish skin.  Thick toenails.  Inability to get or maintain an erection (erectile dysfunction). People with PVD are more prone to developing ulcers and sores on their toes, feet, or legs. These may take longer than normal to heal. DIAGNOSIS Your health care provider may diagnose PVD from your signs and symptoms. The health care provider will also do a physical exam. You may have tests to find out what is causing your PVD and determine its severity. Tests may include:  Blood pressure recordings from your arms and legs and measurements of the strength of your pulses (pulse volume recordings).  Imaging studies using sound waves to take pictures of   the blood flow through your blood vessels (Doppler ultrasound).  Injecting a dye into your blood vessels before having imaging studies using:  X-rays (angiogram or arteriogram).  Computer-generated X-rays (CT angiogram).  A powerful electromagnetic field and a computer (magnetic resonance angiogram or MRA). TREATMENT Treatment for PVD depends on the cause of your condition and the severity of your symptoms. It also depends on your age. Underlying  causes need to be treated and controlled. These include long-lasting (chronic) conditions, such as diabetes, high cholesterol, and high blood pressure. You may need to first try making lifestyle changes and taking medicines. Surgery may be needed if these do not work. Lifestyle changes may include:  Quitting smoking.  Exercising regularly.  Following a low-fat, low-cholesterol diet. Medicines may include:  Blood thinners to prevent blood clots.  Medicines to improve blood flow.  Medicines to improve your blood cholesterol levels. Surgical procedures may include:  A procedure that uses an inflated balloon to open a blocked artery and improve blood flow (angioplasty).  A procedure to put in a tube (stent) to keep a blocked artery open (stent implant).  Surgery to reroute blood flow around a blocked artery (peripheral bypass surgery).  Surgery to remove dead tissue from an infected wound on the affected limb.  Amputation. This is surgical removal of the affected limb. This may be necessary in cases of acute ischemic limb that are not improved through medical or surgical treatments. HOME CARE INSTRUCTIONS  Take medicines only as directed by your health care provider.  Do not use any tobacco products, including cigarettes, chewing tobacco, or electronic cigarettes. If you need help quitting, ask your health care provider.  Lose weight if you are overweight, and maintain a healthy weight as directed by your health care provider.  Eat a diet that is low in fat and cholesterol. If you need help, ask your health care provider.  Exercise regularly. Ask your health care provider to suggest some good activities for you.  Use compression stockings or other mechanical devices as directed by your health care provider.  Take good care of your feet.  Wear comfortable shoes that fit well.  Check your feet often for any cuts or sores. SEEK MEDICAL CARE IF:  You have cramps in your legs  while walking.  You have leg pain when you are at rest.  You have coldness in a leg or foot.  Your skin changes.  You have erectile dysfunction.  You have cuts or sores on your feet that are not healing. SEEK IMMEDIATE MEDICAL CARE IF:  Your arm or leg turns cold and blue.  Your arms or legs become red, warm, swollen, painful, or numb.  You have chest pain or trouble breathing.  You suddenly have weakness in your face, arm, or leg.  You become very confused or lose the ability to speak.  You suddenly have a very bad headache or lose your vision.   This information is not intended to replace advice given to you by your health care provider. Make sure you discuss any questions you have with your health care provider.   Document Released: 06/24/2004 Document Revised: 06/07/2014 Document Reviewed: 10/25/2013 Elsevier Interactive Patient Education 2016 [employer]  

## 2016-03-26 NOTE — Assessment & Plan Note (Signed)
blood pressure control important in reducing the progression of atherosclerotic disease. On appropriate oral medications.  

## 2016-03-26 NOTE — Assessment & Plan Note (Signed)
lipid control important in reducing the progression of atherosclerotic disease. Continue statin therapy  

## 2016-03-26 NOTE — Assessment & Plan Note (Signed)
Although much of her lower extremity symptoms could represent neuropathy from her diabetes, peripheral arterial disease could be playing a major contributing role. Some of her symptoms do sound worrisome for ischemic rest pain and she does describe claudication symptoms. I have recommended arterial duplex and ABIs to be performed at the patient's convenience near future. I discussed the pathophysiology and natural history of peripheral arterial disease. I will see the patient back following the studies to discuss the results and determine further treatment options. She is on appropriate medical therapy with antiplatelet and statin therapy.

## 2016-04-05 ENCOUNTER — Ambulatory Visit (INDEPENDENT_AMBULATORY_CARE_PROVIDER_SITE_OTHER): Payer: Medicare HMO | Admitting: Podiatry

## 2016-04-05 DIAGNOSIS — E1142 Type 2 diabetes mellitus with diabetic polyneuropathy: Secondary | ICD-10-CM | POA: Diagnosis not present

## 2016-04-05 NOTE — Progress Notes (Signed)
She presents today for follow-up of her neuropathy. She states that she stopped taking her medication because it was making her too dizzy.  Objective: No changes in physical exam.  Assessment: Diabetic polyneuropathy.  Plan: Secondary to vertigo fall risk we have decided to discontinue the use of the gabapentin. I will request a compounded agent to help control with her bilateral foot pain. Follow-up with her in about 6 weeks.

## 2016-04-19 ENCOUNTER — Other Ambulatory Visit: Payer: Self-pay | Admitting: Family Medicine

## 2016-04-19 DIAGNOSIS — Z1239 Encounter for other screening for malignant neoplasm of breast: Secondary | ICD-10-CM

## 2016-05-05 ENCOUNTER — Ambulatory Visit (INDEPENDENT_AMBULATORY_CARE_PROVIDER_SITE_OTHER): Payer: Medicare HMO | Admitting: Vascular Surgery

## 2016-05-05 ENCOUNTER — Encounter (INDEPENDENT_AMBULATORY_CARE_PROVIDER_SITE_OTHER): Payer: Medicare HMO

## 2016-05-11 ENCOUNTER — Ambulatory Visit
Admission: RE | Admit: 2016-05-11 | Discharge: 2016-05-11 | Disposition: A | Discharge status: Home / Self Care | Payer: Medicare HMO | Source: Ambulatory Visit | Attending: Family Medicine | Admitting: Family Medicine

## 2016-05-11 DIAGNOSIS — Z1239 Encounter for other screening for malignant neoplasm of breast: Secondary | ICD-10-CM

## 2016-05-11 DIAGNOSIS — Z13820 Encounter for screening for osteoporosis: Secondary | ICD-10-CM | POA: Insufficient documentation

## 2016-05-11 DIAGNOSIS — Z1231 Encounter for screening mammogram for malignant neoplasm of breast: Secondary | ICD-10-CM | POA: Diagnosis not present

## 2016-05-17 ENCOUNTER — Ambulatory Visit (INDEPENDENT_AMBULATORY_CARE_PROVIDER_SITE_OTHER): Payer: Medicare HMO | Admitting: Podiatry

## 2016-05-17 DIAGNOSIS — E1151 Type 2 diabetes mellitus with diabetic peripheral angiopathy without gangrene: Secondary | ICD-10-CM | POA: Diagnosis not present

## 2016-05-17 DIAGNOSIS — M79609 Pain in unspecified limb: Secondary | ICD-10-CM | POA: Diagnosis not present

## 2016-05-17 DIAGNOSIS — E1142 Type 2 diabetes mellitus with diabetic polyneuropathy: Secondary | ICD-10-CM

## 2016-05-17 DIAGNOSIS — B351 Tinea unguium: Secondary | ICD-10-CM | POA: Diagnosis not present

## 2016-05-17 NOTE — Progress Notes (Signed)
She presents today for follow-up diabetic peripheral neuropathy states it is doing some better with the compounding cream that we had made for her.  Objective: Vital signs are stable she is alert and oriented 3 states that her blood sugars and elevated slightly and her feet still hurt some degree but not like a did before.  Assessment: Diabetic peripheral neuropathy. 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  Plan: Debrided toenails 1 through 5 bilateral continue use of the compounded cream for neuropathy and I will follow-up with her as needed.

## 2016-05-21 ENCOUNTER — Ambulatory Visit (INDEPENDENT_AMBULATORY_CARE_PROVIDER_SITE_OTHER): Payer: Medicare HMO

## 2016-05-21 ENCOUNTER — Encounter (INDEPENDENT_AMBULATORY_CARE_PROVIDER_SITE_OTHER): Payer: Self-pay | Admitting: Vascular Surgery

## 2016-05-21 ENCOUNTER — Ambulatory Visit (INDEPENDENT_AMBULATORY_CARE_PROVIDER_SITE_OTHER): Payer: Medicare HMO | Admitting: Vascular Surgery

## 2016-05-21 VITALS — BP 181/81 | HR 88 | Resp 16 | Wt 162.6 lb

## 2016-05-21 DIAGNOSIS — Z794 Long term (current) use of insulin: Secondary | ICD-10-CM

## 2016-05-21 DIAGNOSIS — M79605 Pain in left leg: Secondary | ICD-10-CM

## 2016-05-21 DIAGNOSIS — I70213 Atherosclerosis of native arteries of extremities with intermittent claudication, bilateral legs: Secondary | ICD-10-CM

## 2016-05-21 DIAGNOSIS — I10 Essential (primary) hypertension: Secondary | ICD-10-CM | POA: Diagnosis not present

## 2016-05-21 DIAGNOSIS — E118 Type 2 diabetes mellitus with unspecified complications: Secondary | ICD-10-CM | POA: Diagnosis not present

## 2016-05-21 DIAGNOSIS — M79604 Pain in right leg: Secondary | ICD-10-CM

## 2016-05-21 DIAGNOSIS — E785 Hyperlipidemia, unspecified: Secondary | ICD-10-CM | POA: Diagnosis not present

## 2016-05-21 NOTE — Assessment & Plan Note (Signed)
lipid control important in reducing the progression of atherosclerotic disease. Continue statin therapy  

## 2016-05-21 NOTE — Assessment & Plan Note (Signed)
blood pressure control important in reducing the progression of atherosclerotic disease. On appropriate oral medications.  

## 2016-05-21 NOTE — Assessment & Plan Note (Signed)
blood glucose control important in reducing the progression of atherosclerotic disease. Also, involved in wound healing. On appropriate medications.  

## 2016-05-21 NOTE — Assessment & Plan Note (Signed)
Likely a combination of arterial insufficiency and neuropathic pain.

## 2016-05-21 NOTE — Assessment & Plan Note (Addendum)
Her non-invasive studies today demonstrate significant arterial insufficiency with a right ABI of 0.59 and a left ABI of 0.78 with external iliac/common femoral disease bilaterally, worse on the right than the left.  Her symptoms have become disabling, and she now desires intervention to try to improve her perfusion and improve her symptoms.  Risks and benefits are discussed and she is agreeable to proceed. It was discussed with the patient that if the bulk of the disease was in the common femoral arteries, open surgical therapy may be necessary.

## 2016-05-21 NOTE — Progress Notes (Signed)
MRN : [id number]  Wendy SchirmerBetty Taylor Taylor is a 75 y.o. (04-Jan-1941) female who presents with chief complaint of  Chief Complaint  Patient presents with  . Follow-up  .  History of Present Illness: Patient returns today in follow up of her leg pain and claudication.  She continues to worsen and now has disabling leg pain with activity.  Her right leg may be a little worse, but both legs are bothersome.  She does not have ulceration or infection.   Her non-invasive studies today demonstrate significant arterial insufficiency with a right ABI of 0.59 and a left ABI of 0.78 with external iliac/common femoral disease bilaterally, worse on the right than the left.       Past Medical History:  Diagnosis Date  . Diabetes mellitus without complication (HCC)   . Heart disease   . Heart murmur   . Hyperlipidemia   . Hypertension          Past Surgical History:  Procedure Laterality Date  . cardiac bypass   2002  . CHOLECYSTECTOMY    . EYE SURGERY    . tubial ligation   1969         Family History  Problem Relation Age of Onset  . Ovarian cancer Sister   . Kidney disease Neg Hx   . Bladder Cancer Neg Hx   No history of bleeding disorders, clotting disorders, or aneurysms  Social History     Social History  Substance Use Topics  . Smoking status: Former Games developermoker  . Smokeless tobacco: Never Used  . Alcohol use No  No IV drug use      Allergies  Allergen Reactions  . Canagliflozin Other (See Comments)  . Cephalexin Nausea And Vomiting  . Ciprofloxacin Nausea And Vomiting  . Penicillins Nausea And Vomiting          Current Outpatient Prescriptions  Medication Sig Dispense Refill  . amLODipine (NORVASC) 2.5 MG tablet   1  . azelastine (OPTIVAR) 0.05 % ophthalmic solution     . Cholecalciferol (VITAMIN D-1000 MAX ST) 1000 UNITS tablet Take by mouth.    . clopidogrel (PLAVIX) 75 MG tablet   1  . docusate sodium (COLACE) 100 MG capsule Take 1 capsule  (100 mg total) by mouth 2 (two) times daily as needed for moderate constipation. 20 capsule 2  . gabapentin (NEURONTIN) 300 MG capsule Take 300 mg by mouth.    . gabapentin (NEURONTIN) 300 MG capsule Take 1 capsule (300 mg total) by mouth at bedtime. 30 capsule 3  . insulin NPH-regular Human (NOVOLIN 70/30) (70-30) 100 UNIT/ML injection Inject into the skin.    Marland Kitchen. lisinopril (PRINIVIL,ZESTRIL) 20 MG tablet   3  . loratadine (CLARITIN) 10 MG tablet Take 10 mg by mouth daily.    . metFORMIN (GLUCOPHAGE) 500 MG tablet Daily.    Marland Kitchen. ofloxacin (OCUFLOX) 0.3 % ophthalmic solution     . simvastatin (ZOCOR) 20 MG tablet Take by mouth.    . Urea 39 % CREA Apply 1 application topically daily. 226.8 g 2   No current facility-administered medications for this visit.       REVIEW OF SYSTEMS (Negative unless checked)  Constitutional: [] Weight loss  [] Fever  [] Chills Cardiac: [] Chest pain   [] Chest pressure   [] Palpitations   [] Shortness of breath when laying flat   [] Shortness of breath at rest   [] Shortness of breath with exertion. Vascular:  [x] Pain in legs with walking   [] Pain in legs  at rest   [] Pain in legs when laying flat   [] Claudication   [] Pain in feet when walking  [x] Pain in feet at rest  [] Pain in feet when laying flat   [] History of DVT   [] Phlebitis   [] Swelling in legs   [] Varicose veins   [] Non-healing ulcers Pulmonary:   [] Uses home oxygen   [] Productive cough   [] Hemoptysis   [] Wheeze  [] COPD   [] Asthma Neurologic:  [] Dizziness  [] Blackouts   [] Seizures   [] History of stroke   [] History of TIA  [] Aphasia   [] Temporary blindness   [] Dysphagia   [] Weakness or numbness in arms   [] Weakness or numbness in legs Musculoskeletal:  [] Arthritis   [x] Joint swelling   [x] Joint pain   [] Low back pain Hematologic:  [] Easy bruising  [] Easy bleeding   [] Hypercoagulable state   [] Anemic  [] Hepatitis Gastrointestinal:  [] Blood in stool   [] Vomiting blood  [] Gastroesophageal  reflux/heartburn   [] Abdominal pain Genitourinary:  [] Chronic kidney disease   [] Difficult urination  [] Frequent urination  [] Burning with urination   [] Hematuria Skin:  [] Rashes   [] Ulcers   [] Wounds Psychological:  [] History of anxiety   []  History of major depression.    Physical Exam BP 139/66 (BP Location: Right Arm)   Pulse 83   Resp 16   Ht 5\' 2"  (1.575 m)   Wt 74.4 kg (164 lb)   LMP  (LMP Unknown)   BMI 30.00 kg/m  Gen:  WD/WN, NAD Head: [place]/AT, No temporalis wasting. Prominent temp pulse not noted. Ear/Nose/Throat: Hearing grossly intact, nares w/o erythema or drainage, oropharynx w/o Erythema/Exudate Eyes: Conjunctiva clear, sclera non-icteric  Neck: trachea midline.  No bruit or JVD.  Pulmonary:  Good air movement, clear to auscultation bilaterally.  Cardiac: RRR, normal S1, S2, no Murmurs, rubs or gallops. Vascular:  Vessel Right Left  Radial Palpable Palpable  Ulnar Palpable Palpable  Brachial Palpable Palpable  Carotid Palpable, without bruit Palpable, without bruit  Aorta Not palpable N/A  Femoral Palpable Palpable  Popliteal Not Palpable Not Palpable  PT 1+ Palpable 1+ Palpable  DP Not Palpable Trace Palpable   Gastrointestinal: soft, non-tender/non-distended. No guarding/reflex. No masses, surgical incisions, or scars. Musculoskeletal: M/S 5/5 throughout.  No deformity or atrophy. No edema. Scattered varicosities bilaterally Neurologic: Sensation grossly intact in extremities.  Symmetrical.  Speech is fluent. Motor exam as listed above. Psychiatric: Judgment intact, Mood & affect appropriate for pt's clinical situation. Dermatologic: No rashes or ulcers noted.  No cellulitis or open wounds. Lymph : No Cervical, Axillary, or Inguinal lymphadenopathy.     Labs No results found for this or any previous visit (from the past 2160 hour(s)).  Radiology Dg Bone Density  Result Date: 05/11/2016 EXAM: DUAL X-RAY ABSORPTIOMETRY (DXA) FOR BONE MINERAL  DENSITY IMPRESSION: Dear DR Lanora ManisELIZABETH WHITE, Your patient Wendy LenteBetty Stueve completed a BMD test on 05/11/2016 [facility] Advance DXA System (analysis version: 14.10) manufactured by [employer]. The following summarizes the results of our evaluation. PATIENT BIOGRAPHICAL: Name: Wendy Taylor, Wendy Taylor Patient ID: [id number] Birth Date: 29-Oct-1940 Height: 62.0 in. Gender: Female Exam Date: 05/11/2016 Weight: 163.4 lbs. Indications: Advanced Age, Caucasian, diabetic, Early Menopause, Height Loss, POSTmenopausal Fractures: Treatments: 81 MG ASPIRIN, insulin, metformin, plavix, Vitamin D ASSESSMENT: The BMD measured at Femur Neck Right is 1.011 g/cm2 with a T-score of -0.2. This patient is considered normal according to World Health Organization [facility](WHO) criteria. Patient is not a candidate for FRAX assessmnet due to normal report. Site Region Measured Measured  WHO Young Adult BMD Date       Age      Classification T-score DualFemur Neck Right 05[age]11/2016 74.4 Normal -0.2 1.011 g/cm2 AP Spine L1-L4 05/11/2016 74.4 Normal 4.2 1.689 g/cm2 World Health Organization [facility]) criteria for post-menopausal, Caucasian Women: Normal:       T-score at or above -1 SD Osteopenia:   T-score between -1 and -2.5 SD Osteoporosis: T-score at or below -2.5 SD RECOMMENDATIONS: National Osteoporosis Foundation recommends that FDA-approved medical therapies be considered in postemenopausal women and men age 81 or older with a: 1. Hip or vertebral (clinical or morphometric) fracture. 2. T-score of < -2.5at the spine or hip. 3. Ten-year fracture probability by FRAX of 3% or greater for hip fracture or 20% or greater for major osteoporotic fracture. All treatment decisions require clinical judgment and consideration of individual patient factors, including patient preferences, co-morbidities, previous drug use, risk factors not captured in the FRAX model (e.g. falls, vitamin D deficiency, increased bone turnover, interval significant decline in bone  density) and possible under - or over-estimation of fracture risk by FRAX. All patients should ensure an adequate intake of dietary calcium (1200 mg/d) and vitamin D (800 IU daily) unless contraindicated. FOLLOW-UP: People with diagnosed cases of osteoporosis or at high risk for fracture should have regular bone mineral density tests. For patients eligible for Medicare, routine testing is allowed once every 2 years. The testing frequency can be increased to one year for patients who have rapidly progressing disease, those who are receiving or discontinuing medical therapy to restore bone mass, or have additional risk factors. I have reviewed this report, and agree with the above findings. [facility] Radiology Electronically Signed   By: Natasha Mead M.D.   On: 05/11/2016 09:56   Mm Digital Screening Bilateral  Result Date: 05/11/2016 CLINICAL DATA:  Screening. EXAM: DIGITAL SCREENING BILATERAL MAMMOGRAM WITH CAD COMPARISON:  Previous exam(s). ACR Breast Density Category b: There are scattered areas of fibroglandular density. FINDINGS: There are no findings suspicious for malignancy. Images were processed with CAD. IMPRESSION: No mammographic evidence of malignancy. A result letter of this screening mammogram will be mailed directly to the patient. RECOMMENDATION: Screening mammogram in one year. (Code:SM-B-01Y) BI-RADS CATEGORY  1: Negative. Electronically Signed   By: Gerome Sam III M.D   On: 05/11/2016 10:44      Assessment/Plan  Pain in limb Likely a combination of arterial insufficiency and neuropathic pain.  Hyperlipidemia lipid control important in reducing the progression of atherosclerotic disease. Continue statin therapy   Essential hypertension blood pressure control important in reducing the progression of atherosclerotic disease. On appropriate oral medications.   Diabetes (HCC) blood glucose control important in reducing the progression of atherosclerotic disease. Also,  involved in wound healing. On appropriate medications.   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  Her symptoms have become disabling, and she now desires intervention to try to improve her perfusion and improve her symptoms.  Risks and benefits are discussed and she is agreeable to proceed. It was discussed with the patient that if the bulk of the disease was in the common femoral arteries, open surgical therapy may be necessary.     Festus Barren, MD  05/21/2016 7:31 PM    This note was created with Dragon medical transcription system.  Any errors from dictation are purely unintentional

## 2016-07-01 ENCOUNTER — Encounter (INDEPENDENT_AMBULATORY_CARE_PROVIDER_SITE_OTHER): Payer: Medicare HMO

## 2016-07-01 ENCOUNTER — Ambulatory Visit (INDEPENDENT_AMBULATORY_CARE_PROVIDER_SITE_OTHER): Payer: Medicare HMO | Admitting: Vascular Surgery

## 2016-08-16 ENCOUNTER — Ambulatory Visit: Payer: Medicare HMO | Admitting: Podiatry

## 2016-10-05 ENCOUNTER — Encounter: Payer: Self-pay | Admitting: *Deleted

## 2016-10-05 ENCOUNTER — Encounter: Payer: Medicare HMO | Attending: Internal Medicine | Admitting: *Deleted

## 2016-10-05 VITALS — BP 134/66 | Ht 63.0 in | Wt 160.8 lb

## 2016-10-05 DIAGNOSIS — Z6828 Body mass index (BMI) 28.0-28.9, adult: Secondary | ICD-10-CM | POA: Diagnosis not present

## 2016-10-05 DIAGNOSIS — Z713 Dietary counseling and surveillance: Secondary | ICD-10-CM | POA: Diagnosis not present

## 2016-10-05 DIAGNOSIS — E119 Type 2 diabetes mellitus without complications: Secondary | ICD-10-CM | POA: Diagnosis present

## 2016-10-05 NOTE — Progress Notes (Signed)
Diabetes Self-Management Education  Visit Type: First/Initial  Appt. Start Time: 1055 Appt. End Time: 1210  10/05/2016  [name], identified by name and date of birth, is a 76 y.o. female with a diagnosis of Diabetes: Type 2.   ASSESSMENT  Blood pressure 134/66, height 5\' 3"  (1.6 m), weight 160 lb 12.8 oz (72.9 kg). Body mass index is 28.48 kg/m.      Diabetes Self-Management Education - 10/05/16 1221      Visit Information   Visit Type First/Initial     Initial Visit   Diabetes Type Type 2   Are you currently following a meal plan? No   Are you taking your medications as prescribed? Yes   Date Diagnosed 20 yrs ago     Health Coping   How would you rate your overall health? Good     Psychosocial Assessment   Patient Belief/Attitude about Diabetes Other (comment)  "I don't know"   Self-care barriers None   Self-management support Doctor's office   Patient Concerns Nutrition/Meal planning;Medication;Monitoring;Weight Control   Special Needs None   Preferred Learning Style Auditory;Hands on   Learning Readiness Ready   How often do you need to have someone help you when you read instructions, pamphlets, or other written materials from your doctor or pharmacy? 1 - Never   What is the last grade level you completed in school? 10th     Pre-Education Assessment   Patient understands the diabetes disease and treatment process. Needs Review   Patient understands incorporating nutritional management into lifestyle. Needs Instruction   Patient undertands incorporating physical activity into lifestyle. Needs Instruction   Patient understands using medications safely. Needs Review   Patient understands monitoring blood glucose, interpreting and using results Needs Review   Patient understands prevention, detection, and treatment of acute complications. Needs Review   Patient understands prevention, detection, and treatment of chronic complications. Needs Review   Patient  understands how to develop strategies to address psychosocial issues. Needs Review   Patient understands how to develop strategies to promote health/change behavior. Needs Instruction     Complications   Last HgB A1C per patient/outside source 8 %  08/20/16   How often do you check your blood sugar? 1-2 times/day   Fasting Blood glucose range (mg/dL) 11-914;782-956  Pt reports FBG's 100-140's mg/dL   Postprandial Blood glucose range (mg/dL) --  She reports bedtime readings 200's mg/dL.    Number of hypoglycemic episodes per month --  1 low blood sugar readings since MD changed insulin dosage.    Have you had a dilated eye exam in the past 12 months? Yes   Have you had a dental exam in the past 12 months? Yes   Are you checking your feet? Yes   How many days per week are you checking your feet? 7     Dietary Intake   Breakfast cereal and milk with occasional fruit   Lunch ham or peanut butter sandwich, soup, occasional fruit   Dinner meat (fried chicken), beans, peas, potaotes, corn, salad   Snack (evening) peanut butter, occasionally sweets   Beverage(s) water, milk, coffee, diet soda, occasional 1/2 and 1/2 tea     Exercise   Exercise Type Light (walking / raking leaves)   How many days per week to you exercise? 2   How many minutes per day do you exercise? 10   Total minutes per week of exercise 20     Patient Education   Previous Diabetes Education  Yes (please comment)  Pt came to classes in the past.   Disease state  Explored patient's options for treatment of their diabetes   Nutrition management  Role of diet in the treatment of diabetes and the relationship between the three main macronutrients and blood glucose level;Food label reading, portion sizes and measuring food.;Reviewed blood glucose goals for pre and post meals and how to evaluate the patients' food intake on their blood glucose level.;Meal timing in regards to the patients' current diabetes medication.   Physical  activity and exercise  Role of exercise on diabetes management, blood pressure control and cardiac health.   Medications Taught/reviewed insulin injection, site rotation, insulin storage and needle disposal.;Reviewed patients medication for diabetes, action, purpose, timing of dose and side effects.   Monitoring Purpose and frequency of SMBG.;Taught/discussed recording of test results and interpretation of SMBG.;Identified appropriate SMBG and/or A1C goals.   Acute complications Taught treatment of hypoglycemia - the 15 rule.   Chronic complications Relationship between chronic complications and blood glucose control   Psychosocial adjustment Identified and addressed patients feelings and concerns about diabetes     Individualized Goals (developed by patient)   Reducing Risk Decrease medications Prevent diabetes complications Lose weight     Outcomes   Expected Outcomes Demonstrated interest in learning. Expect positive outcomes   Future DMSE --  3 weeks      Individualized Plan for Diabetes Self-Management Training:   Learning Objective:  Patient will have a greater understanding of diabetes self-management. Patient education plan is to attend individual and/or group sessions per assessed needs and concerns.   Plan:   Patient Instructions  Check blood sugars 2 x day before breakfast and before supper every day and as needed Bring blood sugar records to the next appointment Exercise: Continue walking  for  10  minutes  2 days a week and increase as tolerated Eat 3 meals day,  1-2 snacks a day Space meals 4-6 hours apart Avoid sugar sweetened drinks (tea) Limit desserts/sweets and fried foods Complete 3 Day Food Record and bring to next appt Carry fast acting glucose and a snack at all times Rotate injection sites Roll insulin bottle instead of shaking Return for appointment on:  Tuesday Oct 26, 2016 [facility] 10[facility]  Expected Outcomes:  Demonstrated interest in  learning. Expect positive outcomes  Education material provided:  General Meal Planning Guidelines Simple Meal Plan 3 Day Food Record Symptoms, causes and treatments of Hypoglycemia  If problems or questions, patient to contact team via:  Wendy SettlerSheila Ahmiyah Coil, RN, CCM, CDE [phone]  Future DSME appointment:  (3 weeks)  Oct 26, 2016 with the dietitian

## 2016-10-05 NOTE — Patient Instructions (Addendum)
Check blood sugars 2 x day before breakfast and before supper every day and as needed  Bring blood sugar records to the next appointment  Exercise: Continue walking  for  10  minutes  2 days a week and increase as tolerated  Eat 3 meals day,  1-2 snacks a day Space meals 4-6 hours apart Avoid sugar sweetened drinks (tea) Limit desserts/sweets and fried foods Complete 3 Day Food Record and bring to next appt  Carry fast acting glucose and a snack at all times  Rotate injection sites Roll insulin bottle instead of shaking  Return for appointment on:  Tuesday Oct 26, 2016 [facility] 10[facility]

## 2016-10-26 ENCOUNTER — Encounter: Payer: Self-pay | Admitting: Dietician

## 2016-10-26 ENCOUNTER — Encounter: Payer: Medicare HMO | Admitting: Dietician

## 2016-10-26 VITALS — BP 124/70 | Ht 63.0 in | Wt 162.0 lb

## 2016-10-26 DIAGNOSIS — E119 Type 2 diabetes mellitus without complications: Secondary | ICD-10-CM | POA: Diagnosis not present

## 2016-10-26 NOTE — Progress Notes (Signed)
Diabetes Self-Management Education  Visit Type:  Follow-up  Appt. Start Time: 1015 Appt. End Time: 1115  10/26/2016  [name], identified by name and date of birth, is a 76 y.o. female with a diagnosis of Diabetes:  .   ASSESSMENT  Blood pressure 124/70, height 5\' 3"  (1.6 m), weight 162 lb (73.5 kg). Body mass index is 28.7 kg/m.       Diabetes Self-Management Education - 10/26/16 1023      Complications   How often do you check your blood sugar? 1-2 times/day   Fasting Blood glucose range (mg/dL) [id number]  [phone]   Postprandial Blood glucose range (mg/dL) 47-829;562-13070-129;130-179  [phone] + one reading of 243 due to overeating per patient   Number of hypoglycemic episodes per month 0  none since recent insulin adjustment   Have you had a dilated eye exam in the past 12 months? Yes   Have you had a dental exam in the past 12 months? Yes  10/2015; will schedule another appoinment soon   Are you checking your feet? Yes   How many days per week are you checking your feet? 7     Dietary Intake   Breakfast 3 meals daily; 1-2 snacks (no regular schedule for snacks)   Beverage(s) water, diet drinks, coffee with splenda     Exercise   Exercise Type Light (walking / raking leaves)   How many days per week to you exercise? 2   How many minutes per day do you exercise? 10   Total minutes per week of exercise 20     Patient Education   Nutrition management  Role of diet in the treatment of diabetes and the relationship between the three main macronutrients and blood glucose level;Food label reading, portion sizes and measuring food.;Reviewed blood glucose goals for pre and post meals and how to evaluate the patients' food intake on their blood glucose level.;Meal options for control of blood glucose level and chronic complications.;Other (comment)  basic meal planning with plate method and food models   Physical activity and exercise  Role of exercise on diabetes management, blood pressure  control and cardiac health.;Helped patient identify appropriate exercises in relation to his/her diabetes, diabetes complications and other health issue.   Monitoring Taught/discussed recording of test results and interpretation of SMBG.   Acute complications Taught treatment of hypoglycemia - the 15 rule.   Chronic complications Assessed and discussed foot care and prevention of foot problems     Post-Education Assessment   Patient understands the diabetes disease and treatment process. Demonstrates understanding / competency   Patient understands incorporating nutritional management into lifestyle. Demonstrates understanding / competency   Patient undertands incorporating physical activity into lifestyle. Demonstrates understanding / competency   Patient understands using medications safely. Demonstrates understanding / competency   Patient understands monitoring blood glucose, interpreting and using results Demonstrates understanding / competency   Patient understands prevention, detection, and treatment of acute complications. Demonstrates understanding / competency   Patient understands prevention, detection, and treatment of chronic complications. Demonstrates understanding / competency   Patient understands how to develop strategies to address psychosocial issues. Demonstrates understanding / competency   Patient understands how to develop strategies to promote health/change behavior. Demonstrates understanding / competency     Outcomes   Program Status Completed      Learning Objective:  Patient will have a greater understanding of diabetes self-management. Patient education plan is to attend individual and/or group sessions per assessed needs and concerns.  Wendy Taylor is generally eating balanced meals; she feels she needs to work further on controlling food portions, so we set goals to further improve carb control and increase low-carb vegetable intake.    Plan:   Patient  Instructions   Continue working to control carbohydrate portions to 1 cup (fist-size) or less.   Include generous portions of low-carb veggies to make up for smaller amounts of starches.   Keep working to include some exercise such as walking on a regular basis.     Expected Outcomes:  Demonstrated interest in learning. Expect positive outcomes  Education material provided: Planning a Balanced Meal; Quick and Healthy Meal Ideas  If problems or questions, patient to contact team via:  Phone  Future DSME appointment: -  as needed

## 2016-10-26 NOTE — Patient Instructions (Signed)
   Continue working to control carbohydrate portions to 1 cup (fist-size) or less.   Include generous portions of low-carb veggies to make up for smaller amounts of starches.   Keep working to include some exercise such as walking on a regular basis.

## 2017-09-29 ENCOUNTER — Other Ambulatory Visit: Payer: Self-pay | Admitting: Family Medicine

## 2017-09-29 ENCOUNTER — Ambulatory Visit
Admission: RE | Admit: 2017-09-29 | Discharge: 2017-09-29 | Disposition: A | Discharge status: Home / Self Care | Payer: Medicare HMO | Source: Ambulatory Visit | Attending: Family Medicine | Admitting: Family Medicine

## 2017-09-29 DIAGNOSIS — R052 Subacute cough: Secondary | ICD-10-CM

## 2017-09-29 DIAGNOSIS — R05 Cough: Secondary | ICD-10-CM | POA: Insufficient documentation

## 2018-02-09 ENCOUNTER — Other Ambulatory Visit: Payer: Self-pay | Admitting: Family Medicine

## 2018-02-09 DIAGNOSIS — Z1231 Encounter for screening mammogram for malignant neoplasm of breast: Secondary | ICD-10-CM

## 2018-02-14 ENCOUNTER — Ambulatory Visit: Payer: Medicare HMO

## 2018-03-01 ENCOUNTER — Encounter (INDEPENDENT_AMBULATORY_CARE_PROVIDER_SITE_OTHER): Payer: Self-pay

## 2018-03-01 ENCOUNTER — Ambulatory Visit
Admission: RE | Admit: 2018-03-01 | Discharge: 2018-03-01 | Disposition: A | Discharge status: Home / Self Care | Payer: Medicare HMO | Source: Ambulatory Visit | Attending: Family Medicine | Admitting: Family Medicine

## 2018-03-01 DIAGNOSIS — Z1231 Encounter for screening mammogram for malignant neoplasm of breast: Secondary | ICD-10-CM | POA: Diagnosis not present

## 2019-04-09 ENCOUNTER — Other Ambulatory Visit: Payer: Self-pay | Admitting: Family Medicine

## 2019-04-09 DIAGNOSIS — Z1231 Encounter for screening mammogram for malignant neoplasm of breast: Secondary | ICD-10-CM

## 2019-04-25 ENCOUNTER — Inpatient Hospital Stay: Admission: RE | Admit: 2019-04-25 | Payer: Medicare HMO | Source: Ambulatory Visit

## 2019-05-08 ENCOUNTER — Ambulatory Visit: Payer: Medicare HMO

## 2019-05-11 ENCOUNTER — Other Ambulatory Visit: Payer: Self-pay

## 2019-05-11 ENCOUNTER — Ambulatory Visit
Admission: RE | Admit: 2019-05-11 | Discharge: 2019-05-11 | Disposition: A | Discharge status: Home / Self Care | Payer: Medicare HMO | Source: Ambulatory Visit | Attending: Family Medicine | Admitting: Family Medicine

## 2019-05-11 DIAGNOSIS — Z1231 Encounter for screening mammogram for malignant neoplasm of breast: Secondary | ICD-10-CM | POA: Diagnosis present

## 2020-01-21 ENCOUNTER — Emergency Department
Admission: EM | Admit: 2020-01-21 | Discharge: 2020-01-21 | Disposition: A | Discharge status: Home / Self Care | Payer: Medicare HMO | Attending: Emergency Medicine | Admitting: Emergency Medicine

## 2020-01-21 ENCOUNTER — Other Ambulatory Visit: Payer: Self-pay

## 2020-01-21 ENCOUNTER — Emergency Department: Payer: Medicare HMO

## 2020-01-21 DIAGNOSIS — K5790 Diverticulosis of intestine, part unspecified, without perforation or abscess without bleeding: Secondary | ICD-10-CM | POA: Insufficient documentation

## 2020-01-21 DIAGNOSIS — M25512 Pain in left shoulder: Secondary | ICD-10-CM | POA: Insufficient documentation

## 2020-01-21 DIAGNOSIS — Z7982 Long term (current) use of aspirin: Secondary | ICD-10-CM | POA: Insufficient documentation

## 2020-01-21 DIAGNOSIS — I10 Essential (primary) hypertension: Secondary | ICD-10-CM | POA: Diagnosis not present

## 2020-01-21 DIAGNOSIS — M1990 Unspecified osteoarthritis, unspecified site: Secondary | ICD-10-CM | POA: Insufficient documentation

## 2020-01-21 DIAGNOSIS — M549 Dorsalgia, unspecified: Secondary | ICD-10-CM | POA: Diagnosis not present

## 2020-01-21 DIAGNOSIS — I2510 Atherosclerotic heart disease of native coronary artery without angina pectoris: Secondary | ICD-10-CM | POA: Insufficient documentation

## 2020-01-21 DIAGNOSIS — K449 Diaphragmatic hernia without obstruction or gangrene: Secondary | ICD-10-CM | POA: Diagnosis not present

## 2020-01-21 DIAGNOSIS — Z7902 Long term (current) use of antithrombotics/antiplatelets: Secondary | ICD-10-CM | POA: Insufficient documentation

## 2020-01-21 DIAGNOSIS — K7581 Nonalcoholic steatohepatitis (NASH): Secondary | ICD-10-CM | POA: Diagnosis not present

## 2020-01-21 DIAGNOSIS — Z79899 Other long term (current) drug therapy: Secondary | ICD-10-CM | POA: Diagnosis not present

## 2020-01-21 DIAGNOSIS — Z794 Long term (current) use of insulin: Secondary | ICD-10-CM | POA: Diagnosis not present

## 2020-01-21 DIAGNOSIS — R079 Chest pain, unspecified: Secondary | ICD-10-CM | POA: Insufficient documentation

## 2020-01-21 DIAGNOSIS — I7090 Unspecified atherosclerosis: Secondary | ICD-10-CM | POA: Insufficient documentation

## 2020-01-21 DIAGNOSIS — M47815 Spondylosis without myelopathy or radiculopathy, thoracolumbar region: Secondary | ICD-10-CM

## 2020-01-21 DIAGNOSIS — Z951 Presence of aortocoronary bypass graft: Secondary | ICD-10-CM | POA: Insufficient documentation

## 2020-01-21 DIAGNOSIS — K760 Fatty (change of) liver, not elsewhere classified: Secondary | ICD-10-CM

## 2020-01-21 DIAGNOSIS — Z87891 Personal history of nicotine dependence: Secondary | ICD-10-CM | POA: Insufficient documentation

## 2020-01-21 DIAGNOSIS — E119 Type 2 diabetes mellitus without complications: Secondary | ICD-10-CM | POA: Diagnosis not present

## 2020-01-21 LAB — BASIC METABOLIC PANEL
Anion gap: 11 (ref 5–15)
BUN: 13 mg/dL (ref 8–23)
CO2: 27 mmol/L (ref 22–32)
Calcium: 9.4 mg/dL (ref 8.9–10.3)
Chloride: 99 mmol/L (ref 98–111)
Creatinine, Ser: 0.67 mg/dL (ref 0.44–1.00)
GFR calc Af Amer: >60 mL/min (ref >60)
GFR calc non Af Amer: >60 mL/min (ref >60)
Glucose, Bld: 142 mg/dL — ABNORMAL HIGH (ref 70–99)
Potassium: 4.2 mmol/L (ref 3.5–5.1)
Sodium: 137 mmol/L (ref 135–145)

## 2020-01-21 LAB — CBC
HCT: 36 % (ref 36.0–46.0)
Hemoglobin: 12 g/dL (ref 12.0–15.0)
MCH: 30.1 pg (ref 26.0–34.0)
MCHC: 33.3 g/dL (ref 30.0–36.0)
MCV: 90.2 fL (ref 80.0–100.0)
Platelets: 259 10*3/uL (ref 150–400)
RBC: 3.99 MIL/uL (ref 3.87–5.11)
RDW: 13.2 % (ref 11.5–15.5)
WBC: 7.4 10*3/uL (ref 4.0–10.5)
nRBC: 0 % (ref 0.0–0.2)

## 2020-01-21 LAB — TROPONIN I (HIGH SENSITIVITY)
Troponin I (High Sensitivity): 5 ng/L (ref <18)
Troponin I (High Sensitivity): 7 ng/L (ref <18)

## 2020-01-21 MED ORDER — LISINOPRIL 10 MG PO TABS
20.0000 mg | ORAL_TABLET | Freq: Every day | ORAL | Status: DC
  Administered 2020-01-21: 20 mg via ORAL
  Filled 2020-01-21: qty 2

## 2020-01-21 MED ORDER — IOHEXOL 350 MG/ML SOLN
100.0000 mL | Freq: Once | INTRAVENOUS | Status: AC | PRN
  Administered 2020-01-21: 100 mL via INTRAVENOUS

## 2020-01-21 NOTE — ED Triage Notes (Signed)
Pt c/o left sided chest pain that radiates into the back this morning after she woke up. Pt is has a hx of open heart surgery, denies any SOB , nausea or other sx at this time, pt is in NAD>

## 2020-01-21 NOTE — ED Provider Notes (Signed)
[facility] Emergency Department Provider Note  ____________________________________________   First MD Initiated Contact with Patient 01/21/20 1520     (approximate)  I have reviewed the triage vital signs and the nursing notes.   HISTORY  Chief Complaint Chest Pain   HPI Wendy Taylor is a 79 y.o. female with past medical history of HTN, HDL, DM, and CAD status post three-vessel CABG in 2002 who presents for assessment of chest pain that began this morning shortly after she awoke she states radiates to her back.  Patient states she had some back pain in her left shoulder last week but no other similar episodes.  No clear alleviating aggravating factors including positional or exertional.  Patient denies any fevers, chills, headache, sore throat, earache, cough, shortness of breath abdominal pain, nausea, vomiting, diarrhea, dysuria, rash, extremity pain, or other acute complaints.  Denies tobacco use or EtOH use.  No other acute physical complaints per patient at this time.         Past Medical History:  Diagnosis Date  . Diabetes mellitus without complication (HCC)   . Heart disease   . Heart murmur   . Hyperlipidemia   . Hypertension     Patient Active Problem List   Diagnosis Date Noted  . Diabetes (HCC) 03/26/2016  . Essential hypertension 03/26/2016  . Hyperlipidemia 03/26/2016  . Pain in limb 03/26/2016  . 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 APPENDECTOMY    . cardiac bypass   2002  . CHOLECYSTECTOMY    . EYE SURGERY    . TUBAL LIGATION    . tubial ligation   1969    Prior to Admission medications   Medication Sig Start Date End Date Taking? Authorizing Provider  aspirin EC 81 MG tablet Take 81 mg by mouth daily.    [provider]  atorvastatin (LIPITOR) 20 MG tablet Take 20 mg by mouth daily.    [provider]  Cholecalciferol (VITAMIN D-1000 MAX ST) 1000 UNITS tablet Take 1,000 Units by mouth daily.     [provider]  clopidogrel (PLAVIX) 75 MG tablet Take 75 mg by mouth daily.  11/20/14   [provider]  insulin NPH-regular Human (NOVOLIN 70/30) (70-30) 100 UNIT/ML injection Inject 20 units before breakfast, 14 units before dinner. 09/21/16   [provider]  lisinopril (PRINIVIL,ZESTRIL) 20 MG tablet Take 20 mg by mouth daily.  11/20/14   [provider]  loratadine (CLARITIN) 10 MG tablet Take 10 mg by mouth daily.    [provider]  metFORMIN (GLUCOPHAGE-XR) 500 MG 24 hr tablet Take 1,000 mg by mouth daily with breakfast.  09/21/16 09/21/17  [provider]    Allergies Canagliflozin, Cephalexin, Ciprofloxacin, Penicillins, and Gabapentin  Family History  Problem Relation Age of Onset  . Ovarian cancer Sister   . Diabetes Sister   . Diabetes Brother   . Diabetes Brother   . Kidney disease Neg Hx   . Bladder Cancer Neg Hx   . Breast cancer Neg Hx     Social History Social History   Tobacco Use  . Smoking status: Former Smoker    Packs/day: 1.50    Years: 30.00    Pack years: 45.00    Types: Cigarettes    Quit date: 05/31/1996    Years since quitting: 23.6  . Smokeless tobacco: Never Used  Substance Use  Topics  . Alcohol use: No    Alcohol/week: 0.0 standard drinks  . Drug use: Not Currently    Review of Systems  Review of Systems  Constitutional: Negative for chills and fever.  HENT: Negative for sore throat.   Eyes: Negative for pain.  Respiratory: Negative for cough and stridor.   Cardiovascular: Positive for chest pain.  Gastrointestinal: Negative for vomiting.  Musculoskeletal: Positive for back pain.  Skin: Negative for rash.  Neurological: Negative for seizures, loss of consciousness and headaches.  Psychiatric/Behavioral: Negative for suicidal ideas.  All other systems reviewed and are  negative.     ____________________________________________   PHYSICAL EXAM:  VITAL SIGNS: ED Triage Vitals  Enc Vitals Group     BP 01/21/20 1004 (!) 206/71     Pulse Rate 01/21/20 1004 74     Resp 01/21/20 1004 18     Temp 01/21/20 1004 (!) 97.5 F (36.4 C)     Temp Source 01/21/20 1004 Oral     SpO2 01/21/20 1004 96 %     Weight 01/21/20 1000 150 lb (68 kg)     Height 01/21/20 1000 5\' 2"  (1.575 m)     Head Circumference --      Peak Flow --      Pain Score 01/21/20 1000 2     Pain Loc --      Pain Edu? --      Excl. in GC? --    Vitals:   01/21/20 1530 01/21/20 1600  BP: (!) 185/73 (!) 188/72  Pulse: 70 69  Resp:  (!) 22  Temp:    SpO2: 98% 97%   Physical Exam Vitals and nursing note reviewed.  Constitutional:      General: She is not in acute distress.    Appearance: She is well-developed.  HENT:     Head: Normocephalic and atraumatic.     Right Ear: External ear normal.     Left Ear: External ear normal.     Nose: Nose normal.     Mouth/Throat:     Mouth: Mucous membranes are moist.  Eyes:     Conjunctiva/sclera: Conjunctivae normal.  Cardiovascular:     Rate and Rhythm: Normal rate and regular rhythm.     Heart sounds: Murmur heard.  Systolic murmur is present.   Pulmonary:     Effort: Pulmonary effort is normal. No respiratory distress.     Breath sounds: Normal breath sounds.  Abdominal:     Palpations: Abdomen is soft. There is no mass.     Tenderness: There is no abdominal tenderness. There is no right CVA tenderness or left CVA tenderness.  Musculoskeletal:     Cervical back: Neck supple.     Right lower leg: No edema.     Left lower leg: No edema.  Skin:    General: Skin is warm and dry.     Capillary Refill: Capillary refill takes less than 2 seconds.  Neurological:     Mental Status: She is alert and oriented to person, place, and time.  Psychiatric:        Mood and Affect: Mood normal.       ____________________________________________   LABS (all labs ordered are listed, but only abnormal results are displayed)  Labs Reviewed  BASIC METABOLIC PANEL - Abnormal; Notable for the following components:      Result Value   Glucose, Bld 142 (*)    All other components within normal limits  CBC  TROPONIN  I (HIGH SENSITIVITY)  TROPONIN I (HIGH SENSITIVITY)   ____________________________________________  EKG  Sinus rhythm with a ventricular rate of 78, normal axis, unremarkable intervals, no evidence of acute ischemia or other significant underlying arrhythmia. ____________________________________________  RADIOLOGY  ED MD interpretation: Lungs clear without evidence of pneumothorax, focal consolidation, or other acute thoracic process.  Status post bypass graft.  Official radiology report(s): DG Chest 2 View  Result Date: 01/21/2020 CLINICAL DATA:  Chest pain EXAM: CHEST - 2 VIEW COMPARISON:  Sep 29, 2017. FINDINGS: There is slight scarring in the left base. The lungs elsewhere are clear. Heart size and pulmonary vascularity are normal. No adenopathy. There is aortic atherosclerosis. No bone lesions. IMPRESSION: Mild scarring left lower lung region. Lungs elsewhere clear. Heart size normal. Status post coronary artery bypass grafting. Aortic Atherosclerosis (ICD10-I70.0). Electronically Signed   By: Bretta Bang III M.D.   On: 01/21/2020 10:27   CT Angio Chest/Abd/Pel for Dissection W and/or Wo Contrast  Result Date: 01/21/2020 CLINICAL DATA:  79 year old female with chest and back pain. Left side pain radiating to the back this morning after waking. EXAM: CT ANGIOGRAPHY CHEST, ABDOMEN AND PELVIS TECHNIQUE: Multidetector CT imaging through the chest, abdomen and pelvis was performed using the standard protocol during bolus administration of intravenous contrast. Multiplanar reconstructed images and MIPs were obtained and reviewed to evaluate the vascular anatomy.  CONTRAST:  OMNIPAQUE IOHEXOL 350 MG/ML SOLN COMPARISON:  CT Abdomen and Pelvis 04/17/2007. Chest radiographs earlier today. FINDINGS: CTA CHEST FINDINGS Cardiovascular: Negative for thoracic aortic dissection or aneurysm. Prior CABG. Calcified aortic atherosclerosis. Calcified atherosclerosis of the proximal great vessels. Cardiac size is at the upper limits of normal. No pericardial effusion. The central pulmonary arteries also appear to be patent. Mediastinum/Nodes: No mediastinal hematoma or lymphadenopathy. Small chronic hiatal hernia has not significantly changed since 2008. Lungs/Pleura: Major airways are patent. Both lungs are clear aside from minor chronic scarring in the lingula. No pleural effusion. Musculoskeletal: Prior sternotomy. Degenerative changes in the spine. No acute osseous abnormality identified. Review of the MIP images confirms the above findings. CTA ABDOMEN AND PELVIS FINDINGS VASCULAR Extensive Aortoiliac calcified atherosclerosis. Negative for abdominal aortic aneurysm or dissection. There appear to be patent bilateral common iliac artery vascular stents. The major arterial branches of the abdominal aorta remain patent. Bilateral external iliac arteries are patent. There appears to be short segment occlusion of the right internal iliac artery which is reconstituted. There are postoperative changes to the proximal right femoral arteries which remain patent despite atherosclerosis. The left superficial femoral artery is occluded on series 5, image 193 with no visible reconstituted enhancement. Review of the MIP images confirms the above findings. NON-VASCULAR Hepatobiliary: Chronic calcified granuloma at the right liver dome is stable since 2008 and benign. Hepatic steatosis has developed since that time. Chronically absent gallbladder. No bile duct enlargement. Pancreas: Negative. Spleen: Stable since 2008 and negative. Adrenals/Urinary Tract: Normal adrenal glands. Symmetric  bilateral renal enhancement. No nephrolithiasis or hydronephrosis. Proximal ureters are decompressed. Both ureters appear normal to the bladder. Incidental pelvic phleboliths. Unremarkable urinary bladder. Stomach/Bowel: Negative large bowel aside from redundancy and diverticulosis. Diverticulosis is most pronounced in the sigmoid colon, but no active inflammation is identified. The appendix appears diminutive or absent. Negative terminal ileum. No dilated small bowel. Chronic gastric hiatal hernia but otherwise negative stomach. Negative duodenum. No free air, free fluid. Lymphatic: No lymphadenopathy. Reproductive: Negative. Other: No pelvic free fluid. Musculoskeletal: Absent ribs at T12. Lumbar spine degeneration including grade 1 spondylolisthesis  at L4-L5 with severe facet arthropathy and multifactorial moderate to severe spinal stenosis at that level. No acute osseous abnormality identified. Chronic calcified flank injection granulomas are stable since 2008. Review of the MIP images confirms the above findings. IMPRESSION: 1. Negative for Aortic dissection or aneurysm. Aortic Atherosclerosis (ICD10-I70.0). 2. Positive for extensive atherosclerosis and Occluded Left Superficial Femoral Artery. Patent bilateral common iliac artery vascular stents.Postoperative changes to the proximal right femoral arteries. 3. No acute or inflammatory process identified in the chest, abdomen, or pelvis. 4. Hepatic steatosis. Chronic gastric hiatal hernia. Large bowel diverticulosis. Lumbar spine degeneration with spondylolisthesis and moderate to severe spinal stenosis at L4-L5. Electronically Signed   By: Odessa Fleming M.D.   On: 01/21/2020 17:03    ____________________________________________   PROCEDURES  Procedure(s) performed (including Critical Care):  .1-3 Lead EKG Interpretation Performed by: Gilles Chiquito, MD Authorized by: Gilles Chiquito, MD     Interpretation: normal     ECG rate assessment: normal      Rhythm: sinus rhythm     Ectopy: none     Conduction: normal       ____________________________________________   INITIAL IMPRESSION / ASSESSMENT AND PLAN / ED COURSE        Patient presents with above-stated history exam for assessment of chest pain.  Afebrile and slightly hypertensive with otherwise stable vital signs on arrival.  Patient does note that since being in the ED her pain has improved.  Differential includes ACS, pneumonia, dissection, trauma, symptomatic anemia, metabolic derangement, pericarditis, myocarditis, and chest wall pain as well as GERD.  Low suspicion for ACS given reassuring EKG and to nonelevated troponins obtained over 2 hours.  CTA chest obtained shows no evidence of dissection, central PE, pneumonia, pneumothorax, effusion, edema, rib fracture, or other acute intrathoracic process.  Patient is incidentally noted to have a have a extensive atherosclerosis as well as a occluded left superficial femoral artery with patent collaterals.  She is also note of chronic hiatal hernia.  CBC and BMP unremarkable.  Patient was given her dose of home blood pressure medicine while in ED.  Given reassuring work-up with patient stating improvement of symptoms is unclear the precise etiology of her pain but I do believe she is safe for discharge with plan for close outpatient PCP follow-up.  Patient states she would make an appointment to see her PCP tomorrow or the following day.  Discharged stable condition.  Strict return precautions advised and discussed.  Medications  lisinopril (ZESTRIL) tablet 20 mg (20 mg Oral Given 01/21/20 1812)  iohexol (OMNIPAQUE) 350 MG/ML injection 100 mL (100 mLs Intravenous Contrast Given 01/21/20 1621)    ____________________________________________   FINAL CLINICAL IMPRESSION(S) / ED DIAGNOSES  Final diagnoses:  Chest pain, unspecified type  Atherosclerosis  Hiatal hernia  Diverticulosis  Hepatic steatosis  Osteoarthritis of  thoracolumbar spine, unspecified spinal osteoarthritis complication status  Hypertension, unspecified type     ED Discharge Orders    None       Note:  This document was prepared using Dragon voice recognition software and may include unintentional dictation errors.   Gilles Chiquito, MD 01/21/20 2138

## 2020-06-05 ENCOUNTER — Ambulatory Visit
Admission: EM | Admit: 2020-06-05 | Discharge: 2020-06-05 | Disposition: A | Discharge status: Other Acute Inpatient Hospital | Payer: Medicare HMO | Attending: Family Medicine | Admitting: Family Medicine

## 2020-06-05 ENCOUNTER — Other Ambulatory Visit: Payer: Self-pay

## 2020-06-05 DIAGNOSIS — M542 Cervicalgia: Secondary | ICD-10-CM | POA: Insufficient documentation

## 2020-06-05 DIAGNOSIS — M79602 Pain in left arm: Secondary | ICD-10-CM | POA: Diagnosis not present

## 2020-06-05 LAB — COMPREHENSIVE METABOLIC PANEL
ALT: 17 U/L (ref 0–44)
AST: 23 U/L (ref 15–41)
Albumin: 4.1 g/dL (ref 3.5–5.0)
Alkaline Phosphatase: 53 U/L (ref 38–126)
Anion gap: 9 (ref 5–15)
BUN: 16 mg/dL (ref 8–23)
CO2: 26 mmol/L (ref 22–32)
Calcium: 9.4 mg/dL (ref 8.9–10.3)
Chloride: 99 mmol/L (ref 98–111)
Creatinine, Ser: 0.69 mg/dL (ref 0.44–1.00)
GFR, Estimated: >60 mL/min (ref >60)
Glucose, Bld: 134 mg/dL — ABNORMAL HIGH (ref 70–99)
Potassium: 3.9 mmol/L (ref 3.5–5.1)
Sodium: 134 mmol/L — ABNORMAL LOW (ref 135–145)
Total Bilirubin: 0.6 mg/dL (ref 0.3–1.2)
Total Protein: 7.1 g/dL (ref 6.5–8.1)

## 2020-06-05 LAB — CBC WITH DIFFERENTIAL/PLATELET
Abs Immature Granulocytes: 0.02 10*3/uL (ref 0.00–0.07)
Basophils Absolute: 0 10*3/uL (ref 0.0–0.1)
Basophils Relative: 0 %
Eosinophils Absolute: 0 10*3/uL (ref 0.0–0.5)
Eosinophils Relative: 0 %
HCT: 36.1 % (ref 36.0–46.0)
Hemoglobin: 12.1 g/dL (ref 12.0–15.0)
Immature Granulocytes: 0 %
Lymphocytes Relative: 14 %
Lymphs Abs: 1.3 10*3/uL (ref 0.7–4.0)
MCH: 30.2 pg (ref 26.0–34.0)
MCHC: 33.5 g/dL (ref 30.0–36.0)
MCV: 90 fL (ref 80.0–100.0)
Monocytes Absolute: 0.8 10*3/uL (ref 0.1–1.0)
Monocytes Relative: 9 %
Neutro Abs: 6.9 10*3/uL (ref 1.7–7.7)
Neutrophils Relative %: 77 %
Platelets: 278 10*3/uL (ref 150–400)
RBC: 4.01 MIL/uL (ref 3.87–5.11)
RDW: 13.4 % (ref 11.5–15.5)
WBC: 9 10*3/uL (ref 4.0–10.5)
nRBC: 0 % (ref 0.0–0.2)

## 2020-06-05 LAB — TROPONIN I (HIGH SENSITIVITY): Troponin I (High Sensitivity): 6 ng/L (ref <18)

## 2020-06-05 MED ORDER — ASPIRIN 81 MG PO CHEW
324.0000 mg | CHEWABLE_TABLET | Freq: Once | ORAL | Status: AC
  Administered 2020-06-05: 324 mg via ORAL

## 2020-06-05 NOTE — ED Provider Notes (Signed)
[facility]    CSN: [id number] Arrival date & time: 06/05/20  1330      History   Chief Complaint Chief Complaint  Patient presents with  . Arm Pain    left   HPI   80 year old female with an extensive past medical history presents with left arm pain.  Started around 10:00 this morning.  Patient reports left upper arm pain.  She also reports some left-sided neck pain.  Patient has a longstanding vascular history.  Patient has heart disease and peripheral vascular disease.  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  Recent CT scan of the head neck revealed spondylosis and spinal stenosis at C5-C6.  Patient denies chest pain.  She rates her pain a 6/10 in severity.  No relieving factors.  Patient does note that she is fatigued.  She also reports that she has been nauseated.  No diaphoresis.  She has had some ongoing dizziness as well.  She is followed closely by cardiology/vascular.  She has an upcoming appointment on Monday.  No fever.  No respiratory symptoms.  No other complaints.  Past Medical History:  Diagnosis Date  . Diabetes mellitus without complication (HCC)   . Heart disease   . Heart murmur   . Hyperlipidemia   . Hypertension     Patient Active Problem List   Diagnosis Date Noted  . Diabetes (HCC) 03/26/2016  . Essential hypertension 03/26/2016  . Hyperlipidemia 03/26/2016  . Pain in limb 03/26/2016  . 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 APPENDECTOMY    . cardiac bypass   2002  . CHOLECYSTECTOMY    . EYE SURGERY    . TUBAL LIGATION    . tubial ligation   1969    OB History   No obstetric history on file.      Home Medications    Prior to Admission medications   Medication Sig Start Date End Date Taking? Authorizing Provider  atorvastatin (LIPITOR) 20 MG tablet Take 20 mg by mouth daily.   Yes [provider]  Cholecalciferol 25 MCG (1000 UT) tablet Take 1,000 Units by mouth daily.    Yes [provider]  clopidogrel (PLAVIX) 75 MG tablet Take 75 mg by mouth daily.  11/20/14  Yes [provider]  insulin NPH-regular Human (NOVOLIN 70/30) (70-30) 100 UNIT/ML injection Inject 20 units before breakfast, 14 units before dinner. 09/21/16  Yes [provider]  lisinopril (PRINIVIL,ZESTRIL) 20 MG tablet Take 20 mg by mouth daily.  11/20/14  Yes [provider]  loratadine (CLARITIN) 10 MG tablet Take 10 mg by mouth daily.  06/05/20  [provider]  metFORMIN (GLUCOPHAGE-XR) 500 MG 24 hr tablet Take 1,000 mg by mouth daily with breakfast.  09/21/16 06/05/20  [provider]    Family History Family History  Problem Relation Age of Onset  . Ovarian cancer Sister   . Diabetes Sister   . Diabetes Brother   . Diabetes Brother   . Kidney disease Neg Hx   . Bladder Cancer Neg Hx   . Breast cancer Neg Hx     Social History Social History   Tobacco Use  . Smoking status: Former Smoker    Packs/day: 1.50    Years: 30.00    Pack years: 45.00    Types: Cigarettes    Quit date: 05/31/1996    Years since  quitting: 24.0  . Smokeless tobacco: Never Used  Substance Use Topics  . Alcohol use: No    Alcohol/week: 0.0 standard drinks  . Drug use: Not Currently     Allergies   Canagliflozin, Cephalexin, Ciprofloxacin, Penicillins, and Gabapentin   Review of Systems Review of Systems  Constitutional: Positive for fatigue.  Cardiovascular: Negative for chest pain.  Musculoskeletal: Positive for neck pain.       Left arm pain.  Neurological: Positive for dizziness.   Physical Exam Triage Vital Signs ED Triage Vitals  Enc Vitals Group     BP 06/05/20 1449 (!) 196/76     Pulse Rate 06/05/20 1449 76     Resp 06/05/20 1449 18     Temp 06/05/20 1449 98 F (36.7 C)     Temp Source 06/05/20 1449 Oral     SpO2 06/05/20 1449 97 %      Weight 06/05/20 1447 150 lb (68 kg)     Height 06/05/20 1447 5\' 2"  (1.575 m)     Head Circumference --      Peak Flow --      Pain Score 06/05/20 1447 6     Pain Loc --      Pain Edu? --      Excl. in GC? --     Updated Vital Signs BP (!) 196/76 (BP Location: Left Arm)   Pulse 76   Temp 98 F (36.7 C) (Oral)   Resp 18   Ht 5\' 2"  (1.575 m)   Wt 68 kg   LMP  (LMP Unknown)   SpO2 97%   BMI 27.44 kg/m   Visual Acuity Right Eye Distance:   Left Eye Distance:   Bilateral Distance:    Right Eye Near:   Left Eye Near:    Bilateral Near:     Physical Exam Constitutional:      Appearance: She is not ill-appearing.     Comments: Appears fatigued.  HENT:     Head: Normocephalic and atraumatic.  Eyes:     General:        Right eye: No discharge.        Left eye: No discharge.     Conjunctiva/sclera: Conjunctivae normal.  Cardiovascular:     Rate and Rhythm: Normal rate and regular rhythm.     Heart sounds: Murmur heard.    Pulmonary:     Effort: Pulmonary effort is normal.     Breath sounds: Normal breath sounds. No wheezing, rhonchi or rales.  Neurological:     Mental Status: She is alert.  Psychiatric:        Mood and Affect: Mood normal.        Behavior: Behavior normal.      UC Treatments / Results  Labs (all labs ordered are listed, but only abnormal results are displayed) Labs Reviewed  COMPREHENSIVE METABOLIC PANEL - Abnormal; Notable for the following components:      Result Value   Sodium 134 (*)    Glucose, Bld 134 (*)    All other components within normal limits  CBC WITH DIFFERENTIAL/PLATELET  TROPONIN I (HIGH SENSITIVITY)    EKG Interpretation: Normal sinus rhythm with a rate of 77.  Occasional PVC noted.  Normal axis.  Incomplete right bundle branch block.  Probable LAE.  No ST or T wave changes to suggest ischemia.  Radiology No results found.  Procedures Procedures (including critical care time)  Medications Ordered in  UC Medications  aspirin chewable tablet  324 mg (324 mg Oral Given 06/05/20 1613)    Initial Impression / Assessment and Plan / UC Course  I have reviewed the triage vital signs and the nursing notes.  Pertinent labs & imaging results that were available during my care of the patient were reviewed by me and considered in my medical decision making (see chart for details).    80 year old female presents with left arm pain and left-sided neck pain.  Patient is a known vasculopath.  Laboratory studies unremarkable today including a negative troponin.  However, given her past medical history and the duration of her current complaint I feel that she needs further cardiac work-up/repeat troponin testing to ensure that this is not cardiac in nature.  Patient is being transported via EMS.  Patient was given aspirin 324 mg here today.  Final Clinical Impressions(s) / UC Diagnoses   Final diagnoses:  Left arm pain  Neck pain   Discharge Instructions   None    ED Prescriptions    None     PDMP not reviewed this encounter.   Coral Spikes, DO 06/05/20 1640

## 2020-06-05 NOTE — ED Triage Notes (Signed)
Patient states that she has some left arm pain that started this morning around 10am. States that she has also been having some left side neck pain. Denies Chest pain, patient daughter states that they were reviewing her chart and noticed that she had a blockage in her neck but is due to see Vascular doctor next week.

## 2021-02-05 IMAGING — CT CT ANGIO CHEST-ABD-PELV FOR DISSECTION W/ AND WO/W CM
2 of 7 series · 14 of 46 positions shown, 16 images · IV contrast (APPLIED)
Comparison: CT Abdomen and Pelvis 04/17/2007. Chest radiographs
earlier today.

CLINICAL DATA: 79-year-old female with chest and back pain. Left
side pain radiating to the back this morning after waking.

EXAM:
CT ANGIOGRAPHY CHEST, ABDOMEN AND PELVIS
TECHNIQUE: Multidetector CT imaging through the chest, abdomen and pelvis was
performed using the standard protocol during bolus administration of
intravenous contrast. Multiplanar reconstructed images and MIPs were
obtained and reviewed to evaluate the vascular anatomy.
CONTRAST:  100mL OMNIPAQUE IOHEXOL 350 MG/ML SOLN

[Series 5: axial arterial · axial · arterial · 0.83mm/px · z∈[-996,-450]mm · 11 of 210 slices shown, 13 images]
[im 14/210  soft-tissue]
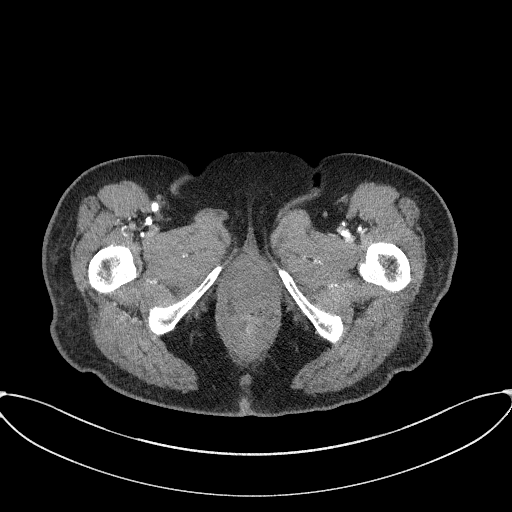
[im 14/210  bone]
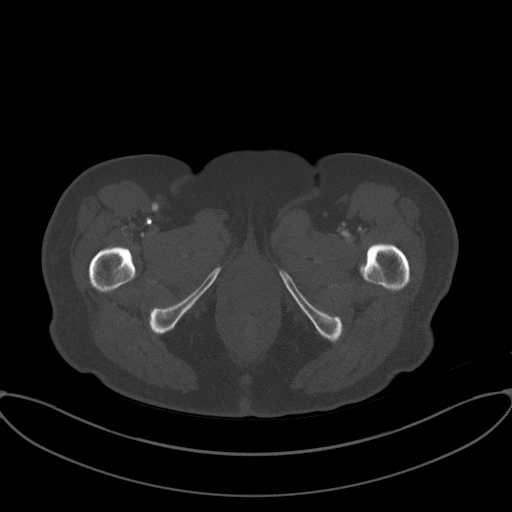
[im 28/210  soft-tissue]
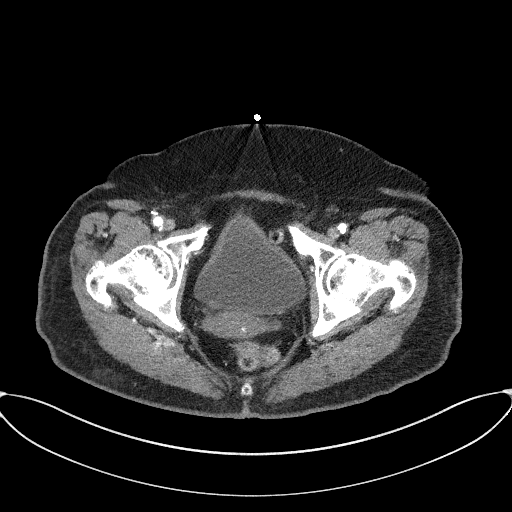
[im 56/210  soft-tissue]
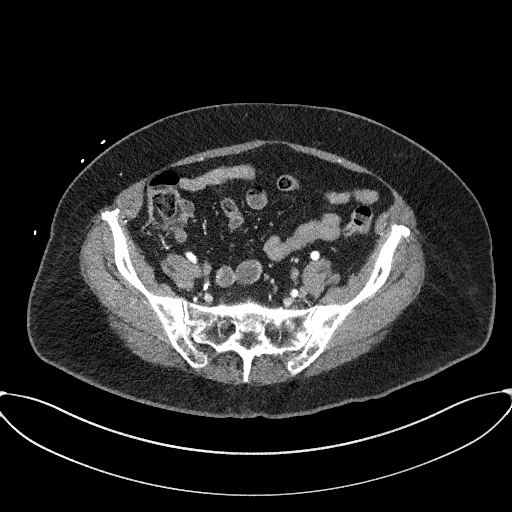
[im 70/210  soft-tissue]
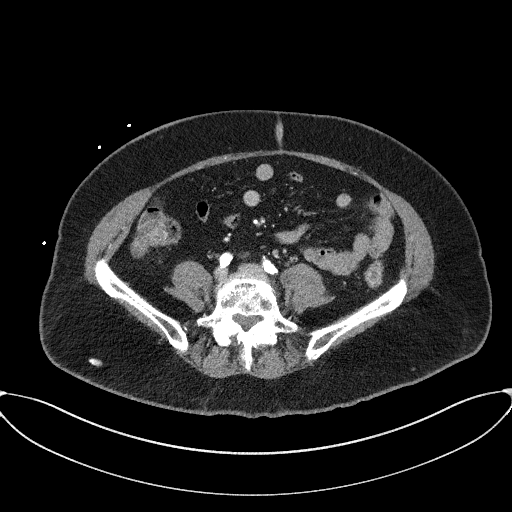
[im 84/210  soft-tissue]
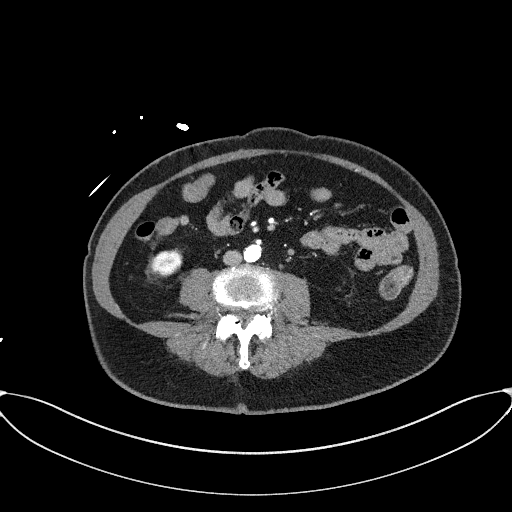
[im 112/210  soft-tissue]
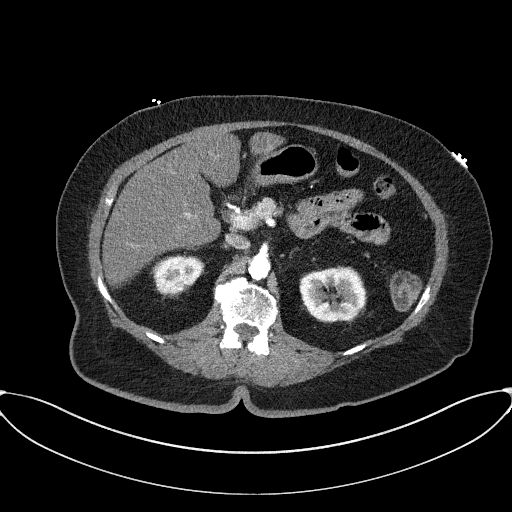
[im 126/210  soft-tissue]
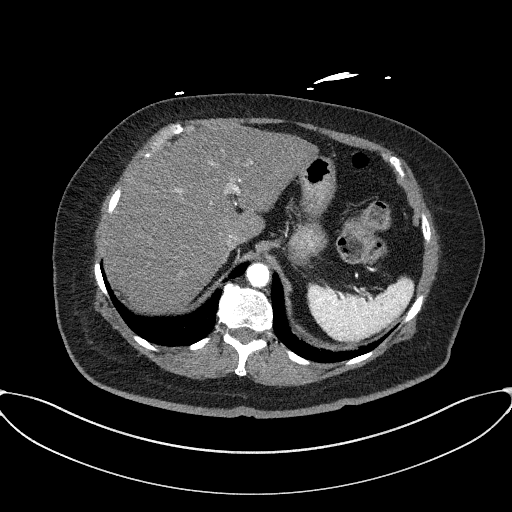
[im 140/210  soft-tissue]
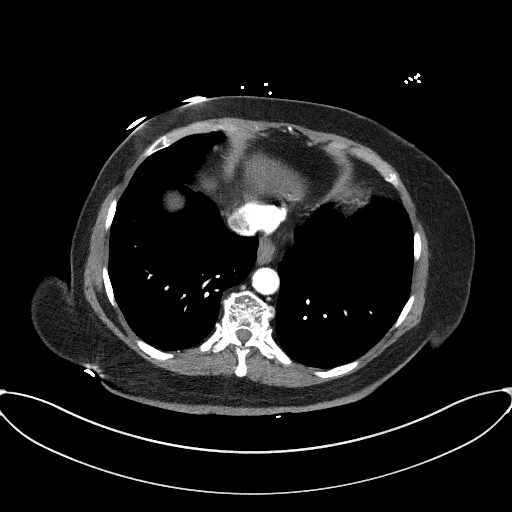
[im 154/210  soft-tissue]
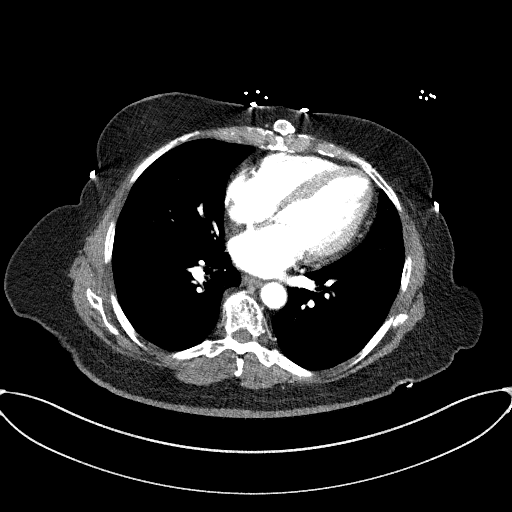
[im 154/210  bone]
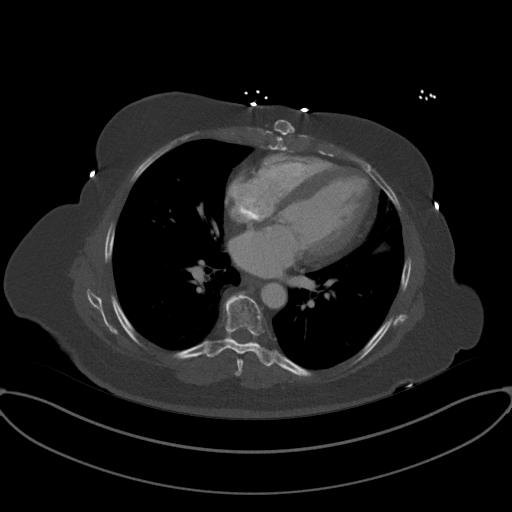
[im 182/210  soft-tissue]
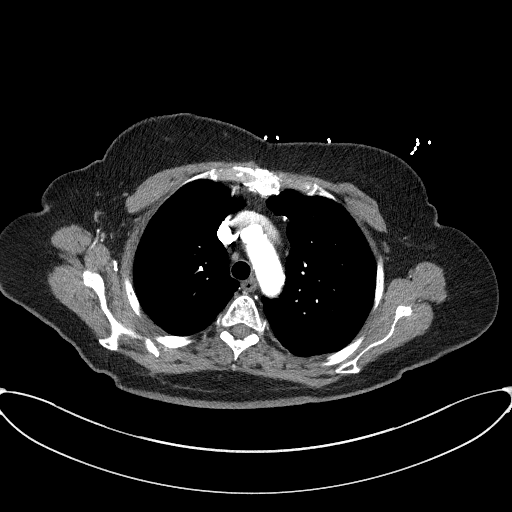
[im 196/210  soft-tissue]
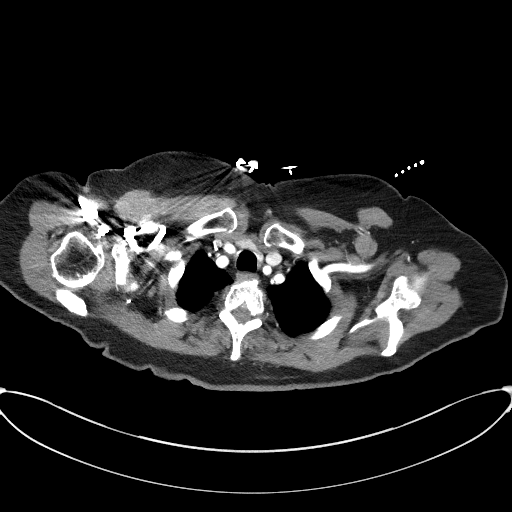

[Series 8: coronals · coronal · 0.90mm/px · 3 of 143 slices shown]
[im 36/143  soft-tissue]
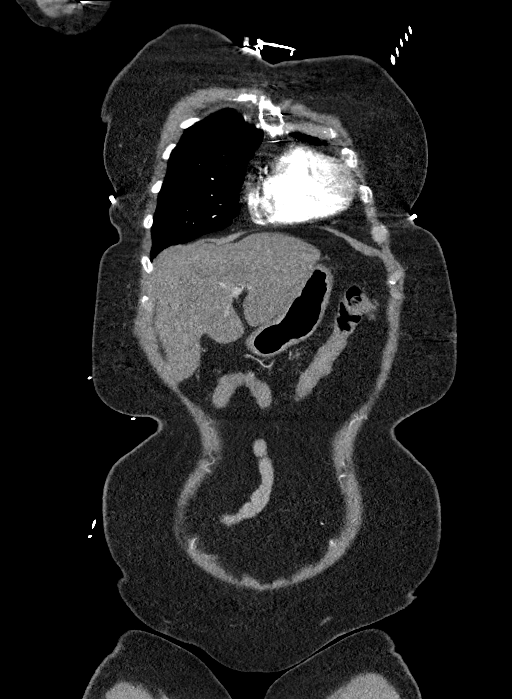
[im 72/143  soft-tissue]
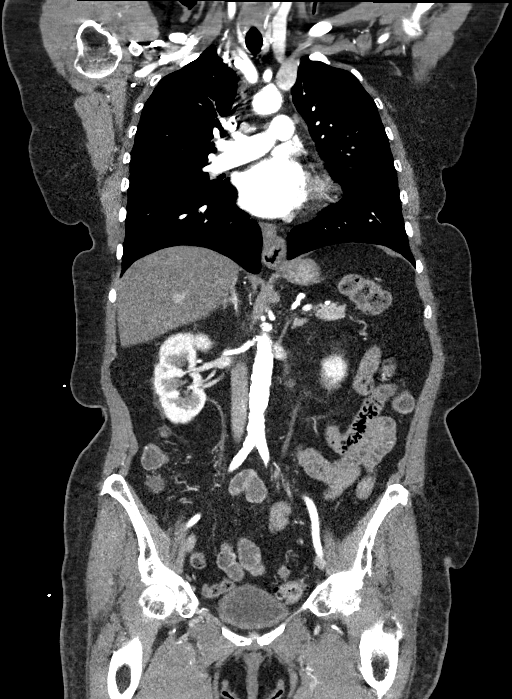
[im 107/143  soft-tissue]
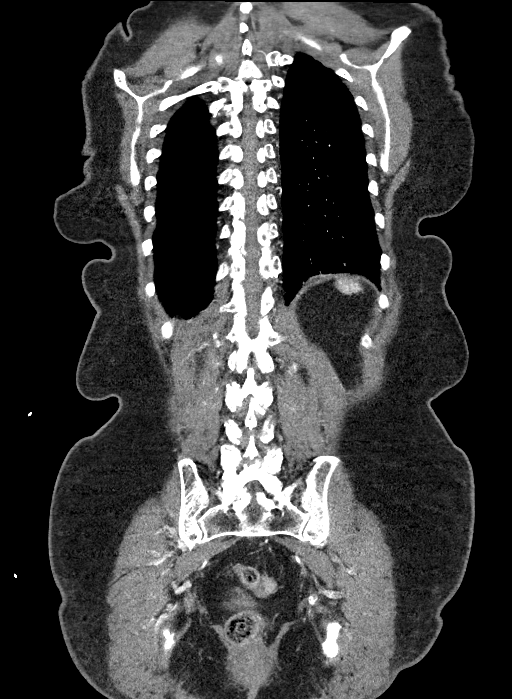

[14 of 46 positions shown; findings below may reference images not displayed]

FINDINGS: CTA CHEST FINDINGS

Cardiovascular: Negative for thoracic aortic dissection or aneurysm.
Prior CABG. Calcified aortic atherosclerosis. Calcified
atherosclerosis of the proximal great vessels. Cardiac size is at
the upper limits of normal. No pericardial effusion. The central
pulmonary arteries also appear to be patent.

Mediastinum/Nodes: No mediastinal hematoma or lymphadenopathy. Small
chronic hiatal hernia has not significantly changed since 0445.

Lungs/Pleura: Major airways are patent. Both lungs are clear aside
from minor chronic scarring in the lingula. No pleural effusion.

Musculoskeletal: Prior sternotomy. Degenerative changes in the
spine. No acute osseous abnormality identified.

Review of the MIP images confirms the above findings.

CTA ABDOMEN AND PELVIS FINDINGS

VASCULAR

Extensive Aortoiliac calcified atherosclerosis. Negative for
abdominal aortic aneurysm or dissection. There appear to be patent
bilateral common iliac artery vascular stents. The major arterial
branches of the abdominal aorta remain patent.

Bilateral external iliac arteries are patent. There appears to be
short segment occlusion of the right internal iliac artery which is
reconstituted.

There are postoperative changes to the proximal right femoral
arteries which remain patent despite atherosclerosis. The left
superficial femoral artery is occluded on series 5, image 193 with
no visible reconstituted enhancement.

Review of the MIP images confirms the above findings.

NON-VASCULAR

Hepatobiliary: Chronic calcified granuloma at the right liver dome
is stable since 0445 and benign. Hepatic steatosis has developed
since that time. Chronically absent gallbladder. No bile duct
enlargement.

Pancreas: Negative.

Spleen: Stable since 0445 and negative.

Adrenals/Urinary Tract: Normal adrenal glands. Symmetric bilateral
renal enhancement. No nephrolithiasis or hydronephrosis. Proximal
ureters are decompressed. Both ureters appear normal to the bladder.
Incidental pelvic phleboliths. Unremarkable urinary bladder.

Stomach/Bowel: Negative large bowel aside from redundancy and
diverticulosis. Diverticulosis is most pronounced in the sigmoid
colon, but no active inflammation is identified. The appendix
appears diminutive or absent. Negative terminal ileum.

No dilated small bowel. Chronic gastric hiatal hernia but otherwise
negative stomach. Negative duodenum. No free air, free fluid.

Lymphatic: No lymphadenopathy.

Reproductive: Negative.

Other: No pelvic free fluid.

Musculoskeletal: Absent ribs at T12. Lumbar spine degeneration
including grade 1 spondylolisthesis at L4-L5 with severe facet
arthropathy and multifactorial moderate to severe spinal stenosis at
that level.

No acute osseous abnormality identified. Chronic calcified flank
injection granulomas are stable since 0445.

Review of the MIP images confirms the above findings.
IMPRESSION: 1. Negative for Aortic dissection or aneurysm. Aortic
Atherosclerosis ([id number]).

2. Positive for extensive atherosclerosis and Occluded Left
Superficial Femoral Artery.
Patent bilateral common iliac artery vascular stents.Postoperative
changes to the proximal right femoral arteries.

3. No acute or inflammatory process identified in the chest,
abdomen, or pelvis.

4. Hepatic steatosis. Chronic gastric hiatal hernia. Large bowel
diverticulosis. Lumbar spine degeneration with spondylolisthesis and
moderate to severe spinal stenosis at L4-L5.

## 2023-07-29 ENCOUNTER — Ambulatory Visit
Admission: EM | Admit: 2023-07-29 | Discharge: 2023-07-29 | Disposition: A | Discharge status: Home / Self Care | Payer: Medicare Other

## 2023-07-29 ENCOUNTER — Encounter: Payer: Self-pay | Admitting: Emergency Medicine

## 2023-07-29 DIAGNOSIS — Z8679 Personal history of other diseases of the circulatory system: Secondary | ICD-10-CM | POA: Diagnosis not present

## 2023-07-29 DIAGNOSIS — R051 Acute cough: Secondary | ICD-10-CM

## 2023-07-29 DIAGNOSIS — R0981 Nasal congestion: Secondary | ICD-10-CM

## 2023-07-29 LAB — GLUCOSE, CAPILLARY: Glucose-Capillary: 238 mg/dL — ABNORMAL HIGH (ref 70–99)

## 2023-07-29 NOTE — ED Triage Notes (Signed)
 Patient c/o cough and chest congestion that started a week ago.  Patient reports heart valve surgery on last Thursday.  Patient unsure of fevers.  Patient reports pain in her ribs and back when she is coughing.

## 2023-07-29 NOTE — ED Provider Notes (Signed)
 [facility]    CSN: [id number] Arrival date & time: 07/29/23  1854      History   Chief Complaint Chief Complaint  Patient presents with   Cough    HPI Wendy Taylor is a 83 y.o. female.   83 year old female, Wendy Taylor, presents to urgent care for evaluation of cough and chest congestion that started a week ago. Pt reports she had heart valve surgery 8 days prior. Pt reports rib and back pain when coughs, (has script for cough med and oxicodone post surgery).  Pt denies illness exposure, has follow up with surgeon in 2 weeks.   Extensive PMH including diabetes, CABG,valve replacement, HTN,hyperlipidemia  The history is provided by the patient and the spouse. No language interpreter was used.    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      Home Medications    Prior to Admission medications   Medication Sig Start Date End Date Taking? Authorizing Provider  amLODipine (NORVASC) 5 MG tablet Take 1 tablet by mouth 2 (two) times daily. 05/04/23  Yes [provider]  aspirin EC 81 MG tablet Take 81 mg by mouth daily. 07/23/23 07/22/24 Yes [provider]  isosorbide dinitrate (ISORDIL) 30 MG tablet Take 30 mg by mouth. 11/04/22  Yes  [provider]  levothyroxine (SYNTHROID) 25 MCG tablet Take 25 mcg by mouth daily before breakfast. 12/26/20  Yes [provider]  metoprolol succinate (TOPROL-XL) 50 MG 24 hr tablet Take 1 tablet by mouth daily. 04/14/22  Yes [provider]  rosuvastatin (CRESTOR) 5 MG tablet Take 1 tablet by mouth daily. 03/05/20  Yes [provider]  atorvastatin (LIPITOR) 20 MG tablet Take 20 mg by mouth daily.    [provider]  Cholecalciferol 25 MCG (1000 UT) tablet Take 1,000 Units by mouth daily.     [provider]  clopidogrel (PLAVIX) 75 MG tablet Take 75 mg by mouth daily.  11/20/14   [provider]  cyanocobalamin (VITAMIN B12) 1000 MCG tablet Take 1,000 mcg by mouth daily.    [provider]  Insulin Lispro Prot & Lispro (HUMALOG MIX 50/50 KWIKPEN) (50-50) 100 UNIT/ML Kwikpen     [provider]  insulin NPH-regular Human (NOVOLIN 70/30) (70-30) 100 UNIT/ML injection Inject 20 units before breakfast, 14 units before dinner. 09/21/16   [provider]  lisinopril (PRINIVIL,ZESTRIL) 20 MG tablet Take 20 mg by mouth daily.  11/20/14   [provider]  loratadine (CLARITIN) 10 MG tablet Take 10 mg by mouth daily.  06/05/20  [provider]  metFORMIN (GLUCOPHAGE-XR) 500 MG  24 hr tablet Take 1,000 mg by mouth daily with breakfast.  09/21/16 06/05/20  [provider]    Family History Family History  Problem Relation Age of Onset   Ovarian cancer Sister    Diabetes Sister    Diabetes Brother    Diabetes Brother    Kidney disease Neg Hx    Bladder Cancer Neg Hx    Breast cancer Neg Hx     Social History Social History   Tobacco Use   Smoking status: Former    Current packs/day: 0.00    Average packs/day: 1.5 packs/day for 30.0 years (45.0 ttl pk-yrs)    Types: Cigarettes    Start date: 05/31/1966    Quit date: 05/31/1996    Years since quitting: 27.1   Smokeless tobacco: Never   Vaping Use   Vaping status: Never Used  Substance Use Topics   Alcohol use: No    Alcohol/week: 0.0 standard drinks of alcohol   Drug use: Not Currently     Allergies   Canagliflozin, Cephalexin, Ciprofloxacin, Penicillins, and Gabapentin   Review of Systems Review of Systems  Constitutional:  Negative for fever.  HENT:  Positive for congestion.   Respiratory:  Positive for cough. Negative for chest tightness.   Cardiovascular:  Negative for chest pain and palpitations.  All other systems reviewed and are negative.    Physical Exam Triage Vital Signs ED Triage Vitals  Encounter Vitals Group     BP 07/29/23 1916 (!) 158/71     Systolic BP Percentile --      Diastolic BP Percentile --      Pulse Rate 07/29/23 1916 100     Resp 07/29/23 1916 14     Temp 07/29/23 1916 98.3 F (36.8 C)     Temp Source 07/29/23 1916 Oral     SpO2 07/29/23 1916 96 %     Weight 07/29/23 1911 149 lb 14.6 oz (68 kg)     Height 07/29/23 1911 5\' 2"  (1.575 m)     Head Circumference --      Peak Flow --      Pain Score 07/29/23 1911 0     Pain Loc --      Pain Education --      Exclude from Growth Chart --    No data found.  Updated Vital Signs BP (!) 158/71 (BP Location: Right Arm)   Pulse 100   Temp 98.3 F (36.8 C) (Oral)   Resp 14   Ht 5\' 2"  (1.575 m)   Wt 149 lb 14.6 oz (68 kg)   LMP  (LMP Unknown)   SpO2 96%   BMI 27.42 kg/m   Visual Acuity Right Eye Distance:   Left Eye Distance:   Bilateral Distance:    Right Eye Near:   Left Eye Near:    Bilateral Near:     Physical Exam Vitals and nursing note reviewed.  Constitutional:      Appearance: She is well-developed and well-groomed.  HENT:     Head: Normocephalic.      Comments: Steri-Strips to recent surgical site    Right Ear: Tympanic membrane normal.     Left Ear: Tympanic membrane normal.     Nose: Congestion present.     Mouth/Throat:     Lips: Pink.     Mouth: Mucous membranes are moist.     Pharynx:  Oropharynx is clear. Uvula midline.  Cardiovascular:     Rate and Rhythm: Regular rhythm.  Tachycardia present.     Heart sounds: Normal heart sounds.  Pulmonary:     Effort: Pulmonary effort is normal.     Breath sounds: Normal breath sounds and air entry.     Comments: 96% on RA, R14 Neurological:     General: No focal deficit present.     Mental Status: She is alert and oriented to person, place, and time.     GCS: GCS eye subscore is 4. GCS verbal subscore is 5. GCS motor subscore is 6.     Cranial Nerves: No cranial nerve deficit.     Sensory: No sensory deficit.  Psychiatric:        Attention and Perception: Attention normal.        Mood and Affect: Mood normal.        Speech: Speech normal.        Behavior: Behavior normal.      UC Treatments / Results  Labs (all labs ordered are listed, but only abnormal results are displayed) Labs Reviewed  GLUCOSE, CAPILLARY - Abnormal; Notable for the following components:      Result Value   Glucose-Capillary 238 (*)    All other components within normal limits  CBG MONITORING, ED    EKG   Radiology No results found.  Procedures Procedures (including critical care time)  Medications Ordered in UC Medications - No data to display  Initial Impression / Assessment and Plan / UC Course  I have reviewed the triage vital signs and the nursing notes.  Pertinent labs & imaging results that were available during my care of the patient were reviewed by me and considered in my medical decision making (see chart for details).  Clinical Course as of 07/29/23 2113  Fri Jul 29, 2023  1934 BS is 238 [JD]    Clinical Course User Index [JD] Virgina Deakins, Para March, NP  Discussed with pt and husband will need to call on call surgeon/PCP regarding further management of cough and nasal congestion. If develop CP,SOB,palpitations,fever, or worsening issues will need to go to ER for further evaluation.   Ddx: Nasal congestion,cough, HTN, post  surgery  Final Clinical Impressions(s) / UC Diagnoses   Final diagnoses:  Nasal congestion  Acute cough  History of hypertension     Discharge Instructions      Please follow up with your PCP/surgeon for further management of nasal congestion/ cough. Take home meds as prescribed post surgery If you have chest pain, palpitations, SOB or worsening symptoms will need to go to Er for further evaluation.    ED Prescriptions   None    PDMP not reviewed this encounter.   Clancy Gourd, NP 07/29/23 2114

## 2023-07-29 NOTE — Discharge Instructions (Addendum)
 Please follow up with your PCP/surgeon for further management of nasal congestion/ cough. Take home meds as prescribed post surgery If you have chest pain, palpitations, SOB or worsening symptoms will need to go to Er for further evaluation.
